# Patient Record
Sex: Male | Born: 1935 | Race: White | Hispanic: No | Marital: Married | Smoking: Former smoker
Health system: Southern US, Community
[De-identification: ages and names within clinical notes are randomized; demographics above are authoritative.]

## PROBLEM LIST (undated history)

## (undated) DIAGNOSIS — C61 Malignant neoplasm of prostate: Secondary | ICD-10-CM

## (undated) DIAGNOSIS — I4891 Unspecified atrial fibrillation: Secondary | ICD-10-CM

## (undated) DIAGNOSIS — E7800 Pure hypercholesterolemia, unspecified: Secondary | ICD-10-CM

## (undated) DIAGNOSIS — H3530 Unspecified macular degeneration: Secondary | ICD-10-CM

## (undated) DIAGNOSIS — I10 Essential (primary) hypertension: Secondary | ICD-10-CM

## (undated) DIAGNOSIS — K859 Acute pancreatitis without necrosis or infection, unspecified: Secondary | ICD-10-CM

## (undated) DIAGNOSIS — E119 Type 2 diabetes mellitus without complications: Secondary | ICD-10-CM

## (undated) DIAGNOSIS — I2510 Atherosclerotic heart disease of native coronary artery without angina pectoris: Secondary | ICD-10-CM

## (undated) HISTORY — DX: Atherosclerotic heart disease of native coronary artery without angina pectoris: I25.10

## (undated) HISTORY — PX: BRAIN SURGERY: SHX531

## (undated) HISTORY — PX: KNEE SURGERY: SHX244

## (undated) HISTORY — PX: TUMOR REMOVAL: SHX12

## (undated) HISTORY — PX: PROSTATECTOMY: SHX69

## (undated) HISTORY — DX: Acute pancreatitis without necrosis or infection, unspecified: K85.90

## (undated) HISTORY — PX: CHOLECYSTECTOMY: SHX55

## (undated) HISTORY — DX: Malignant neoplasm of prostate: C61

## (undated) HISTORY — PX: APPENDECTOMY: SHX54

## (undated) HISTORY — DX: Essential (primary) hypertension: I10

## (undated) HISTORY — DX: Pure hypercholesterolemia, unspecified: E78.00

## (undated) HISTORY — DX: Unspecified macular degeneration: H35.30

## (undated) HISTORY — PX: TONSILLECTOMY: SUR1361

## (undated) HISTORY — DX: Type 2 diabetes mellitus without complications: E11.9

---

## 2007-02-13 ENCOUNTER — Emergency Department (HOSPITAL_COMMUNITY): Admission: EM | Admit: 2007-02-13 | Discharge: 2007-02-13 | Payer: Self-pay | Admitting: Family Medicine

## 2007-10-05 ENCOUNTER — Encounter: Admission: RE | Admit: 2007-10-05 | Discharge: 2007-10-05 | Payer: Self-pay | Admitting: Otolaryngology

## 2007-12-15 ENCOUNTER — Ambulatory Visit (HOSPITAL_COMMUNITY): Admission: RE | Admit: 2007-12-15 | Discharge: 2007-12-15 | Payer: Self-pay | Admitting: Otolaryngology

## 2010-09-23 NOTE — Op Note (Signed)
Vincent Howell, Vincent Howell               ACCOUNT NO.:  [id number]   MEDICAL RECORD NO.:  [id number]          PATIENT TYPE:  AMB   LOCATION:  SDS                          FACILITY:  [facility]   PHYSICIAN:  Suzanna Obey, M.D.       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   PROCEDURES:  Left tympanic mastoidectomy with facial nerve monitoring.   ANESTHESIA:  General.   ESTIMATED BLOOD LOSS:  10 mL.   SURGEON:  Suzanna Obey, MD   INDICATIONS:  This is a 77 year old has had a problem with chronic  drainage from his left ear that has been refractory to medical therapy.  He had a tympanostomy tube placed and it continued to drain.  CT scan  was performed showing mastoid disease and a possible tegmal defect on  the lateral aspect.  The patient had a consultation with Dr. Kerri Perches  to evaluate the possibility of need of repair of the tegmen defect.  The  patient was informed of the risks and benefits of the procedure and  options were discussed.  All questions were answered and consent was  obtained.   OPERATION:  The patient was taken to the operating room and placed in  supine position after adequate general endotracheal tube anesthesia was  placed in the right gaze position.  The facial nerve monitor was  positioned and calibrated with low impedance and a good stimulation and  the postauricular canal was injected with 1% lidocaine with 1:100,000  epinephrine.  The patient was prepped and draped in the usual sterile  manner.  The ear canal was examined.  The tympanostomy tube was still in  place.  The sickle knife was used to make a 6 o'clock and 12 o'clock  incision and  connected with a round knife.  The middle ear was entered  by elevating the tympanomeatal flap.  The incus-stapedial joint appeared  to be intact and there did not appear to be any cholesteatoma debris  within the middle ear.  The postauricular incision was performed,  dissected down to the mastoid.  The canal was entered and the flap was  laid anterior and the self-retaining retractor was positioned.  The  mastoidectomy was then started dissecting down through the lateral  mastoid following the tegmen and the canal.  Within the canal wall out,  there was a lot of mucosal bands and thickening of the mucosa, but no  epithelial debris.  All these bands were broken up.  There was even a  band that completely covered over the antrum keeping it from aerating  the middle ear to the mastoid.  This was all removed and the dissection  was carried down to the fossa incudis where the incus was left intact.  The tegmen was dissected distending it out with  a large diamond bur.  There was no evidence of any obvious dehiscence and no evidence of any  CSF leak.  Where the bone was finished the location on the CT scan, a  bone pate was positioned up into that area and then Gelfoam was placed  inferior to this.  There was no other lesions or area in the mastoid.  All the cells seem to be opened with the dissection.  The mastoid was  flushed with saline and it flowed easily into the middle ear.  The  tympanomeatal flap was then laid back down in its anatomic position.  Gelfoam was placed over the tympanic membrane and flap.  The tube was  left in position.  The Gelfoam was placed into the mastoid, but just a  small amount was placed except where the bone pate was which was packed  fairly tightly.  The  postauricular incision was closed with interrupted 4-0 chromic and a  Dermabond to close the skin.  A dry sterile dressing was placed.  The  facial nerve monitor had never discharged.  The patient was awakened  and  brought to recovery room in stable condition.  Counts were correct.           ______________________________  Suzanna Obey, M.D.     JB/MEDQ  D:  12/15/2007  T:  12/16/2007  Job:  [id number]

## 2011-02-06 LAB — CBC
HCT: 44.2
Hemoglobin: 14.9
MCHC: 33.7
MCV: 90.9
RBC: 4.86

## 2011-02-06 LAB — BASIC METABOLIC PANEL
CO2: 28
Chloride: 101
GFR calc Af Amer: >60
Sodium: 136

## 2016-03-06 ENCOUNTER — Encounter: Payer: Self-pay | Admitting: Podiatry

## 2016-03-06 ENCOUNTER — Ambulatory Visit (INDEPENDENT_AMBULATORY_CARE_PROVIDER_SITE_OTHER): Payer: Medicare Other | Admitting: Podiatry

## 2016-03-06 DIAGNOSIS — M2042 Other hammer toe(s) (acquired), left foot: Secondary | ICD-10-CM

## 2016-03-06 DIAGNOSIS — L601 Onycholysis: Secondary | ICD-10-CM | POA: Diagnosis not present

## 2016-03-06 DIAGNOSIS — M2041 Other hammer toe(s) (acquired), right foot: Secondary | ICD-10-CM

## 2016-03-06 DIAGNOSIS — S90229A Contusion of unspecified lesser toe(s) with damage to nail, initial encounter: Secondary | ICD-10-CM | POA: Diagnosis not present

## 2016-03-06 NOTE — Progress Notes (Signed)
   Subjective:    Patient ID: Vincent Howell, male    DOB: August 08, 1932, 82 y.o.   MRN: [id number]  HPI 82 year old male presents the also concerns of pain to his right big toe and bilateral second toes. He states that his toes all weight hurt when he is walking on the treadmill. He walks 55 minutes in the morning at 45 minutes at nighttime. He states he is painful the tips the toes was on a treadmill. He is unsure if is coming from the toes from the shoes. He said no recent treatment for this. Denies any redness or drainage or any open sores. He is borderline diabetic. Denies any claudication symptoms. No numbness or tingling. No complaints.   Review of Systems  All other systems reviewed and are negative.      Objective:   Physical Exam General: AAO x3, NAD  Dermatological: Right hallux with evidence of previous blister which is healed to the medial aspect. Faint erythema remains with a blister was apposed subjective this area is much improved. Bilateral second toenails are hypertrophic, dystrophic and upon degree with a right second toenail did come off very easily without any bleeding or any open sores. The left second toe does appear to have some subungual hematoma mildly underneath the toenail. There is no edema, erythema, drainage or pus or any signs of infection. No open lesions or pre-ulcerative lesions identified.  Vascular: Dorsalis Pedis artery and Posterior Tibial artery pedal pulses are 2/4 bilateral with immedate capillary fill time. There is no pain with calf compression, swelling, warmth, erythema.   Neruologic: Grossly intact via light touch bilateral. Vibratory intact via tuning fork bilateral. Protective threshold with Semmes Wienstein monofilament intact to all pedal sites bilateral.  Musculoskeletal: Hammertoes are present. No pain, crepitus, or limitation noted with foot and ankle range of motion bilateral. Muscular strength 5/5 in all groups tested bilateral.  Gait:  Unassisted, Nonantalgic.      Assessment & Plan:  82 year old male hammertoes with subungual hematoma/resolved blistering likely due to shoe gear. -Treatment options discussed including all alternatives, risks, and complications -Etiology of symptoms were discussed -I believe the majority pain that he is having is due to his shoe as well as combined that with digital deformity. I discussed in shoe gear changes.  -Second digit toenails are debrided bilaterally in the right second toenail did come off very easily without any bleeding. Discussed that left side the toenail may come off as well. Monitor for infection. -Offloading pads were dispensed -If he continues to have symptoms to call the office. The meantime call any questions or concerns.  Celesta Gentile, DPM

## 2019-05-23 ENCOUNTER — Ambulatory Visit: Payer: Medicare Other | Attending: Internal Medicine

## 2019-05-23 DIAGNOSIS — Z23 Encounter for immunization: Secondary | ICD-10-CM | POA: Insufficient documentation

## 2019-05-23 NOTE — Progress Notes (Signed)
   Covid-19 Vaccination Clinic  Name:  Vincent Howell    MRN: [id number] DOB: May 24, 1932  05/23/2019  Vincent Howell was observed post Covid-19 immunization for 30 minutes based on pre-vaccination screening without incidence. He was provided with Vaccine Information Sheet and instruction to access the V-Safe system.   Vincent Howell was instructed to call 911 with any severe reactions post vaccine: Marland Kitchen Difficulty breathing  . Swelling of your face and throat  . A fast heartbeat  . A bad rash all over your body  . Dizziness and weakness    Immunizations Administered    Name Date Dose VIS Date Route   Pfizer COVID-19 Vaccine 05/23/2019  9:36 AM 0.3 mL 04/21/2019 Intramuscular   Manufacturer: [place]   Lot: [id number]   [place]: [id number]

## 2019-06-12 ENCOUNTER — Ambulatory Visit: Payer: Medicare Other | Attending: Internal Medicine

## 2019-06-12 DIAGNOSIS — Z23 Encounter for immunization: Secondary | ICD-10-CM

## 2019-06-12 NOTE — Progress Notes (Signed)
   Covid-19 Vaccination Clinic  Name:  Vincent Howell    MRN: [id number] DOB: October 21, 1932  06/12/2019  Vincent Howell was observed post Covid-19 immunization for 30 minutes based on pre-vaccination screening without incidence. He was provided with Vaccine Information Sheet and instruction to access the V-Safe system.   Vincent Howell was instructed to call 911 with any severe reactions post vaccine: Marland Kitchen Difficulty breathing  . Swelling of your face and throat  . A fast heartbeat  . A bad rash all over your body  . Dizziness and weakness    Immunizations Administered    Name Date Dose VIS Date Route   Pfizer COVID-19 Vaccine 06/12/2019  9:14 AM 0.3 mL 04/21/2019 Intramuscular   Manufacturer: [place]   Lot: [id number]   [place]: [id number]

## 2021-03-25 NOTE — Progress Notes (Deleted)
    Referring-Lawrence Caryl Comes, MD Reason for referral-atrial fibrillation  HPI: 87 year old male for evaluation of atrial fibrillation at request of Shelly Flatten, MD.  Current Outpatient Medications  Medication Sig Dispense Refill   amLODipine (NORVASC) 5 MG tablet      atorvastatin (LIPITOR) 40 MG tablet Take 40 mg by mouth.     No current facility-administered medications for this visit.    Allergies  Allergen Reactions   Asa [Aspirin]    Penicillins     No past medical history on file.  *** The histories are not reviewed yet. Please review them in the "History" navigator section and refresh this [place].  Social History   Socioeconomic History   Marital status: Married    Spouse name: Not on file   Number of children: Not on file   Years of education: Not on file   Highest education level: Not on file  Occupational History   Not on file  Tobacco Use   Smoking status: Never   Smokeless tobacco: Never  Substance and Sexual Activity   Alcohol use: Not on file   Drug use: Not on file   Sexual activity: Not on file  Other Topics Concern   Not on file  Social History Narrative   Not on file   Social Determinants of Health   Financial Resource Strain: Not on file  Food Insecurity: Not on file  Transportation Needs: Not on file  Physical Activity: Not on file  Stress: Not on file  Social Connections: Not on file  Intimate Partner Violence: Not on file    No family history on file.  ROS: no fevers or chills, productive cough, hemoptysis, dysphasia, odynophagia, melena, hematochezia, dysuria, hematuria, rash, seizure activity, orthopnea, PND, pedal edema, claudication. Remaining systems are negative.  Physical Exam:   There were no vitals taken for this visit.  General:  Well developed/well nourished in NAD Skin warm/dry Patient not depressed No peripheral clubbing Back-normal HEENT-normal/normal eyelids Neck supple/normal carotid upstroke  bilaterally; no bruits; no JVD; no thyromegaly chest - CTA/ normal expansion CV - RRR/normal S1 and S2; no murmurs, rubs or gallops;  PMI nondisplaced Abdomen -NT/ND, no HSM, no mass, + bowel sounds, no bruit 2+ femoral pulses, no bruits Ext-no edema, chords, 2+ DP Neuro-grossly nonfocal  ECG - personally reviewed  A/P  1 atrial fibrillation-  Kirk Ruths, MD

## 2021-04-07 ENCOUNTER — Ambulatory Visit: Payer: Medicare Other | Admitting: Cardiology

## 2021-05-10 NOTE — Progress Notes (Signed)
Referring-Vincent Caryl Comes, MD Reason for referral-atrial fibrillation  HPI: 87 year old male for evaluation of atrial fibrillation at request of Shelly Flatten, MD. Laboratories October 2022 showed hemoglobin 13.2, total cholesterol 116 with LDL 46, sodium 140, potassium 4.9, creatinine 0.8, normal liver functions, TSH 4.842.  Patient recently had an electrocardiogram in the office and it was felt to represent atrial fibrillation.  Cardiology asked to evaluate.  Also of note he states he had a myocardial infarction in the late 80s and had angioplasty at [facility].  Of note he denies dyspnea on exertion, orthopnea, PND, pedal edema, exertional chest pain, palpitations or syncope.  Current Outpatient Medications  Medication Sig Dispense Refill   amLODipine (NORVASC) 5 MG tablet      atorvastatin (LIPITOR) 40 MG tablet Take 40 mg by mouth.     Cholecalciferol (VITAMIN D) 50 MCG (2000 UT) CAPS Take 1 capsule by mouth daily.     fluticasone (VERAMYST) 27.5 MCG/SPRAY nasal spray Place 2 sprays into the nose daily.     glimepiride (AMARYL) 2 MG tablet Take by mouth.     metFORMIN (GLUCOPHAGE-XR) 500 MG 24 hr tablet Take 1,500 mg by mouth daily.     Multiple Vitamins-Minerals (ICAPS AREDS 2 PO) Take 1 tablet by mouth daily.     pyridOXINE (VITAMIN B-6) 100 MG tablet Take 100 mg by mouth daily.     vitamin B-12 (CYANOCOBALAMIN) 500 MCG tablet Take 500 mcg by mouth daily.     No current facility-administered medications for this visit.    Allergies  Allergen Reactions   Aspirin Anaphylaxis   Penicillins Anaphylaxis   Acetaminophen Other (See Comments)    Unknown   2,4-D Dimethylamine (Amisol) Rash   Benzoin Itching and Rash   Other Rash    Silk tape     Past Medical History:  Diagnosis Date   CAD (coronary artery disease)    Diabetes mellitus without complication (HCC)    Hypercholesteremia    Hypertension    Macular degeneration    Pancreatitis    Prostate cancer ([place])      Past Surgical History:  Procedure Laterality Date   APPENDECTOMY     BRAIN SURGERY     CHOLECYSTECTOMY     KNEE SURGERY     PROSTATECTOMY     TONSILLECTOMY      Social History   Socioeconomic History   Marital status: Married    Spouse name: Not on file   Number of children: 2   Years of education: Not on file   Highest education level: Not on file  Occupational History   Not on file  Tobacco Use   Smoking status: Former    Types: Cigarettes   Smokeless tobacco: Never  Substance and Sexual Activity   Alcohol use: Yes    Comment: Occasional   Drug use: Not on file   Sexual activity: Not on file  Other Topics Concern   Not on file  Social History Narrative   Not on file   Social Determinants of Health   Financial Resource Strain: Not on file  Food Insecurity: Not on file  Transportation Needs: Not on file  Physical Activity: Not on file  Stress: Not on file  Social Connections: Not on file  Intimate Partner Violence: Not on file    Family History  Problem Relation Age of Onset   Heart attack Brother     ROS: no fevers or chills, productive cough, hemoptysis, dysphasia, odynophagia, melena, hematochezia, dysuria, hematuria, rash,  seizure activity, orthopnea, PND, pedal edema, claudication. Remaining systems are negative.  Physical Exam:   Blood pressure (!) 152/70, pulse (!) 53, height 5' 9.5" (1.765 m), weight 171 lb 3.2 oz (77.7 kg), SpO2 98 %.  General:  Well developed/well nourished in NAD Skin warm/dry Patient not depressed No peripheral clubbing Back-normal HEENT-normal/normal eyelids Neck supple/normal carotid upstroke bilaterally; no bruits; no JVD; no thyromegaly chest - CTA/ normal expansion CV - RRR/normal S1 and S2; no murmurs, rubs or gallops;  PMI nondisplaced Abdomen -NT/ND, no HSM, no mass, + bowel sounds, no bruit 2+ femoral pulses, no bruits Ext-no edema, chords, 2+ DP Neuro-grossly nonfocal  ECG -February 09, 2021-sinus rhythm  with PACs, no ST changes.  Personally reviewed  Today's electrocardiogram personally reviewed and shows sinus bradycardia at a rate of 57 with sinus arrhythmia, incomplete right bundle branch block and no ST changes.  A/P  1 Question atrial fibrillation-I have personally reviewed the electrocardiograms from Dr. Olin Pia office and today's ECG.  While the patient's rhythm is irregular he is noted to have P waves and I do not think atrial fibrillation is present.  We will therefore not pursue further evaluation.  This was discussed in detail with the patient.  2 coronary artery disease-patient has a history of angioplasty of unknown vessel at [facility] in the 1980s by his report.  He has had no issues since that time.  We will continue statin.  He has a severe allergy to aspirin.  He also states he bleeds freely and is hesitant to take any other medications.  I will not add Plavix.  3 hypertension-blood pressure elevated; he states he typically runs in the 130s to 135 range at home.  We will continue amlodipine at present dose.  This can be advanced in the future if blood pressure is elevated.  4 hyperlipidemia-continue statin.  Most recent lipids outstanding.  Kirk Ruths, MD

## 2021-05-20 ENCOUNTER — Other Ambulatory Visit: Payer: Self-pay

## 2021-05-20 ENCOUNTER — Encounter: Payer: Self-pay | Admitting: Cardiology

## 2021-05-20 ENCOUNTER — Ambulatory Visit (INDEPENDENT_AMBULATORY_CARE_PROVIDER_SITE_OTHER): Payer: Medicare Other | Admitting: Cardiology

## 2021-05-20 VITALS — BP 152/70 | HR 53 | Ht 69.5 in | Wt 171.2 lb

## 2021-05-20 DIAGNOSIS — I480 Paroxysmal atrial fibrillation: Secondary | ICD-10-CM | POA: Diagnosis not present

## 2021-05-20 DIAGNOSIS — I10 Essential (primary) hypertension: Secondary | ICD-10-CM | POA: Diagnosis not present

## 2021-05-20 DIAGNOSIS — E7800 Pure hypercholesterolemia, unspecified: Secondary | ICD-10-CM

## 2021-05-20 DIAGNOSIS — I2510 Atherosclerotic heart disease of native coronary artery without angina pectoris: Secondary | ICD-10-CM

## 2021-05-20 NOTE — Patient Instructions (Signed)
°  Follow-Up: At [facility], you and your health needs are our priority.  As part of our continuing mission to provide you with exceptional heart care, we have created designated Provider Care Teams.  These Care Teams include your primary Cardiologist (physician) and Advanced Practice Providers (APPs -  Physician Assistants and Nurse Practitioners) who all work together to provide you with the care you need, when you need it.  We recommend signing up for the patient portal called "MyChart".  Sign up information is provided on this After Visit Summary.  MyChart is used to connect with patients for Virtual Visits (Telemedicine).  Patients are able to view lab/test results, encounter notes, upcoming appointments, etc.  Non-urgent messages can be sent to your provider as well.   To learn more about what you can do with MyChart, go to [email or web address].    Your next appointment:    As NEEDED

## 2021-05-27 ENCOUNTER — Encounter: Payer: Self-pay | Admitting: Cardiology

## 2021-06-29 ENCOUNTER — Encounter (HOSPITAL_BASED_OUTPATIENT_CLINIC_OR_DEPARTMENT_OTHER): Payer: Self-pay | Admitting: Emergency Medicine

## 2021-06-29 ENCOUNTER — Emergency Department (HOSPITAL_BASED_OUTPATIENT_CLINIC_OR_DEPARTMENT_OTHER)
Admission: EM | Admit: 2021-06-29 | Discharge: 2021-06-29 | Disposition: A | Discharge status: Home / Self Care | Payer: Medicare Other | Attending: Emergency Medicine | Admitting: Emergency Medicine

## 2021-06-29 ENCOUNTER — Other Ambulatory Visit: Payer: Self-pay

## 2021-06-29 ENCOUNTER — Emergency Department (HOSPITAL_BASED_OUTPATIENT_CLINIC_OR_DEPARTMENT_OTHER): Payer: Medicare Other

## 2021-06-29 DIAGNOSIS — Z79899 Other long term (current) drug therapy: Secondary | ICD-10-CM | POA: Diagnosis not present

## 2021-06-29 DIAGNOSIS — R55 Syncope and collapse: Secondary | ICD-10-CM | POA: Insufficient documentation

## 2021-06-29 DIAGNOSIS — R42 Dizziness and giddiness: Secondary | ICD-10-CM | POA: Diagnosis not present

## 2021-06-29 DIAGNOSIS — H538 Other visual disturbances: Secondary | ICD-10-CM | POA: Diagnosis not present

## 2021-06-29 HISTORY — DX: Unspecified atrial fibrillation: I48.91

## 2021-06-29 LAB — DIFFERENTIAL
Abs Immature Granulocytes: 0.01 10*3/uL (ref 0.00–0.07)
Basophils Absolute: 0.1 10*3/uL (ref 0.0–0.1)
Basophils Relative: 1 %
Eosinophils Absolute: 0.2 10*3/uL (ref 0.0–0.5)
Eosinophils Relative: 4 %
Immature Granulocytes: 0 %
Lymphocytes Relative: 24 %
Lymphs Abs: 1.6 10*3/uL (ref 0.7–4.0)
Monocytes Absolute: 0.6 10*3/uL (ref 0.1–1.0)
Monocytes Relative: 9 %
Neutro Abs: 4.1 10*3/uL (ref 1.7–7.7)
Neutrophils Relative %: 62 %

## 2021-06-29 LAB — CBC
HCT: 40.9 % (ref 39.0–52.0)
Hemoglobin: 13.5 g/dL (ref 13.0–17.0)
MCH: 29.9 pg (ref 26.0–34.0)
MCHC: 33 g/dL (ref 30.0–36.0)
MCV: 90.7 fL (ref 80.0–100.0)
Platelets: 207 10*3/uL (ref 150–400)
RBC: 4.51 MIL/uL (ref 4.22–5.81)
RDW: 13.9 % (ref 11.5–15.5)
WBC: 6.7 10*3/uL (ref 4.0–10.5)
nRBC: 0 % (ref 0.0–0.2)

## 2021-06-29 LAB — COMPREHENSIVE METABOLIC PANEL
ALT: 18 U/L (ref 0–44)
AST: 19 U/L (ref 15–41)
Albumin: 4.3 g/dL (ref 3.5–5.0)
Alkaline Phosphatase: 95 U/L (ref 38–126)
Anion gap: 9 (ref 5–15)
BUN: 16 mg/dL (ref 8–23)
CO2: 25 mmol/L (ref 22–32)
Calcium: 9.1 mg/dL (ref 8.9–10.3)
Chloride: 103 mmol/L (ref 98–111)
Creatinine, Ser: 0.78 mg/dL (ref 0.61–1.24)
GFR, Estimated: >60 mL/min (ref >60)
Glucose, Bld: 272 mg/dL — ABNORMAL HIGH (ref 70–99)
Potassium: 3.6 mmol/L (ref 3.5–5.1)
Sodium: 137 mmol/L (ref 135–145)
Total Bilirubin: 0.9 mg/dL (ref 0.3–1.2)
Total Protein: 7.4 g/dL (ref 6.5–8.1)

## 2021-06-29 LAB — PROTIME-INR
INR: 1 (ref 0.8–1.2)
Prothrombin Time: 13.2 seconds (ref 11.4–15.2)

## 2021-06-29 LAB — CBG MONITORING, ED: Glucose-Capillary: 302 mg/dL — ABNORMAL HIGH (ref 70–99)

## 2021-06-29 LAB — APTT: aPTT: 29 seconds (ref 24–36)

## 2021-06-29 MED ORDER — SODIUM CHLORIDE 0.9% FLUSH
3.0000 mL | Freq: Once | INTRAVENOUS | Status: DC
  Filled 2021-06-29: qty 3

## 2021-06-29 NOTE — ED Triage Notes (Signed)
Pt revised and states his symptoms now started on Monday with dizziness but became confused [email or web address]

## 2021-06-29 NOTE — Discharge Instructions (Addendum)
You were evaluated in the Emergency Department and after careful evaluation, we did not find any emergent condition requiring admission or further testing in the hospital.  Your exam/testing today was overall reassuring.  Your laboratory work-up was unremarkable with the exception of an elevated blood glucose to 302 after your breakfast this morning.  Your CT results are as follows: IMPRESSION:  1. No acute intracranial abnormality.  2. Chronic small vessel ischemic disease.  3. Chronic paranasal sinus mucosal disease.       Your symptoms are consistent with most likely near syncopal episodes of unclear etiology.  Recommend you follow-up with cardiology to discuss outpatient cardiac monitoring.  Return for any recurrence of symptoms, new onset syncope, chest pain, heart palpitations, low or significantly elevated blood pressure resulting in symptoms.  Your neurologic exam today was reassuring and normal.  Lower suspicion for TIA or CVA at this time based on your history of present illness and physical exam findings.  Please return to the Emergency Department if you experience any worsening of your condition.  Thank you for allowing [place] to be a part of your care.

## 2021-06-29 NOTE — ED Triage Notes (Signed)
Pt presents with complaints of disorientation yesterday that has waxed and waned. Pt also states his vision has been blurred for a week the patient today also has some dizziness.ambulatory, alert and oriented x4

## 2021-06-29 NOTE — ED Provider Notes (Signed)
[facility] DE[facility] Provider Note   CSN: [id number] Arrival date & time: 06/29/21  0846     History  Chief Complaint  Patient presents with   Dizziness    [name] is a 88 y.o. male.   Dizziness  88 year old male who presents to the emergency department with a chief complaint of lightheadedness.  The patient states that he has had intermittent episodes of lightheadedness over the past 2 days.  He had mild blurred vision but no visual field deficits during that time.  He denied any episodes of syncope.  He denied any other neurologic deficits.  No facial droop, numbness.  No weakness or numbness of the extremities.  No dysarthria or dysphagia.  He states that during this episode he was briefly disoriented but that has since resolved.  He denies any symptoms at this time.  He denies any chest pain, palpitations, shortness of breath, lightheadedness.    Home Medications Prior to Admission medications   Medication Sig Start Date End Date Taking? Authorizing Provider  amLODipine (NORVASC) 5 MG tablet  03/02/16   [provider]  atorvastatin (LIPITOR) 40 MG tablet Take 40 mg by mouth.    [provider]  Cholecalciferol (VITAMIN D) 50 MCG (2000 UT) CAPS Take 1 capsule by mouth daily.    [provider]  fluticasone (VERAMYST) 27.5 MCG/SPRAY nasal spray Place 2 sprays into the nose daily.    [provider]  glimepiride (AMARYL) 2 MG tablet Take by mouth. 11/15/20   [provider]  metFORMIN (GLUCOPHAGE-XR) 500 MG 24 hr tablet Take 1,500 mg by mouth daily. 03/27/21   [provider]  Multiple Vitamins-Minerals (ICAPS AREDS 2 PO) Take 1 tablet by mouth daily.    [provider]  pyridOXINE (VITAMIN B-6) 100 MG tablet Take 100 mg by mouth daily.    [provider]  vitamin B-12 (CYANOCOBALAMIN) 500 MCG tablet Take 500 mcg by mouth daily.    [provider]      Allergies     Aspirin; Penicillins; Acetaminophen; 2,4-d dimethylamine; Benzoin; and Other    Review of Systems   Review of Systems  Neurological:  Positive for light-headedness.  All other systems reviewed and are negative.  Physical Exam Updated Vital Signs BP (!) 147/76    Pulse (!) 54    Temp 97.8 F (36.6 C) (Oral)    Resp 13    SpO2 98%  Physical Exam Vitals and nursing note reviewed.  Constitutional:      General: He is not in acute distress.    Appearance: He is well-developed.  HENT:     Head: Normocephalic and atraumatic.  Eyes:     Conjunctiva/sclera: Conjunctivae normal.     Pupils: Pupils are equal, round, and reactive to light.  Cardiovascular:     Rate and Rhythm: Normal rate. Rhythm irregular.     Heart sounds: No murmur heard. Pulmonary:     Effort: Pulmonary effort is normal. No respiratory distress.     Breath sounds: Normal breath sounds.  Abdominal:     General: There is no distension.     Palpations: Abdomen is soft.     Tenderness: There is no abdominal tenderness. There is no guarding.  Musculoskeletal:        General: No swelling, deformity or signs of injury.     Cervical back: Neck supple.  Skin:    General: Skin is warm and dry.     Capillary Refill: Capillary  refill takes less than 2 seconds.     Findings: No lesion or rash.  Neurological:     General: No focal deficit present.     Mental Status: He is alert. Mental status is at baseline.     Cranial Nerves: No cranial nerve deficit.     Sensory: No sensory deficit.     Motor: No weakness.     Coordination: Coordination normal.     Gait: Gait normal.  Psychiatric:        Mood and Affect: Mood normal.    ED Results / Procedures / Treatments   Labs (all labs ordered are listed, but only abnormal results are displayed) Labs Reviewed  COMPREHENSIVE METABOLIC PANEL - Abnormal; Notable for the following components:      Result Value   Glucose, Bld 272 (*)    All other components within normal limits   CBG MONITORING, ED - Abnormal; Notable for the following components:   Glucose-Capillary 302 (*)    All other components within normal limits  PROTIME-INR  APTT  CBC  DIFFERENTIAL  CBG MONITORING, ED    EKG EKG Interpretation  Date/Time:  Sunday June 29 2021 09:02:00 EST Ventricular Rate:  59 PR Interval:    QRS Duration: 125 QT Interval:  434 QTC Calculation: 430 R Axis:   38 Text Interpretation: Sinus bradycardia Ventricular premature complex Right bundle branch block Reconfirmed by Regan Lemming ([id number]) on 07/01/2021 9:54:08 PM  Radiology No results found.  Procedures Procedures    Medications Ordered in ED Medications - No data to display  ED Course/ Medical Decision Making/ A&P                           Medical Decision Making Amount and/or Complexity of Data Reviewed Labs: ordered. Radiology: ordered.    88 year old male who presents to the emergency department with a chief complaint of lightheadedness.  The patient states that he has had intermittent episodes of lightheadedness over the past 2 days.  He had mild blurred vision but no visual field deficits during that time.  He denied any episodes of syncope.  He denied any other neurologic deficits.  No facial droop, numbness.  No weakness or numbness of the extremities.  No dysarthria or dysphagia.  He states that during this episode he was briefly disoriented but that has since resolved.  He denies any symptoms at this time.  He denies any chest pain, palpitations, shortness of breath, lightheadedness.   On arrival, the patient was afebrile, hemodynamically stable.  He was in very mild sinus bradycardia on cardiac telemetry.  This was confirmed by EKG with sinus bradycardia, ventricular rate 5 9, QTc 430, PVCs present.  No ischemic changes noted.  He is ambulatory without difficulty.  He has a normal neurologic exam and at his baseline mental status.  A CT head was performed which revealed no acute  abnormality. IMPRESSION:  1. No acute intracranial abnormality.  2. Chronic small vessel ischemic disease.  3. Chronic paranasal sinus mucosal disease.   Screening laboratory work-up revealed no significant electrolyte abnormality, mild hyperglycemia to 272, normal renal and liver function.  CBC without a leukocytosis or anemia.  Patient physical exam significant for lungs that were clear to auscultation bilaterally, normal neurologic exam, symmetric pulses equally, irregular rhythm, normal rate on exam.   No cardiac arrhythmia noted on telemetry or EKG.  He follows outpatient with cardiology.  I reviewed the patient's recent cardiology clinic  note.  Patient was evaluated in clinic for possible atrial fibrillation although this was questioned by cardiology with sinus rhythm noted on EKG and no clear atrial fibrillation on EKG.  No further work-up was recommended.  Patient does have a remote history of coronary artery disease.  He currently denies any chest pain, shortness of breath heart palpitations.  He has had mild lightheadedness which has resolved and occurred yesterday.  He has no evidence of new onset heart failure.  No evidence of cardiac arrhythmia.  He appears well-hydrated.  Given the patient's lightheadedness, I discussed and considered admission for observation versus discharge home.  After discussion of risks and benefits of admission for telemetry monitoring versus follow-up outpatient with cardiology for possible outpatient cardiac monitoring, the patient elected to follow-up outpatient with cardiology.  I believe that this is safe and reasonable.  Stable for discharge at this time.   Final Clinical Impression(s) / ED Diagnoses Final diagnoses:  Near syncope    Rx / DC Orders ED Discharge Orders     None         Regan Lemming, MD 07/01/21 2201

## 2021-08-05 ENCOUNTER — Other Ambulatory Visit: Payer: Self-pay

## 2021-08-05 ENCOUNTER — Encounter (HOSPITAL_COMMUNITY): Payer: Self-pay | Admitting: Pharmacy Technician

## 2021-08-05 ENCOUNTER — Inpatient Hospital Stay (HOSPITAL_COMMUNITY)
Admission: EM | Admit: 2021-08-05 | Discharge: 2021-08-08 | DRG: 286 | Disposition: A | Discharge status: Home / Self Care | Payer: Medicare Other | Attending: Cardiology | Admitting: Cardiology

## 2021-08-05 ENCOUNTER — Encounter: Payer: Self-pay | Admitting: Cardiology

## 2021-08-05 ENCOUNTER — Observation Stay (HOSPITAL_COMMUNITY): Payer: Medicare Other

## 2021-08-05 ENCOUNTER — Telehealth: Payer: Self-pay | Admitting: Cardiology

## 2021-08-05 ENCOUNTER — Ambulatory Visit (INDEPENDENT_AMBULATORY_CARE_PROVIDER_SITE_OTHER): Payer: Medicare Other | Admitting: Cardiology

## 2021-08-05 VITALS — BP 92/58 | HR 60 | Ht 69.5 in | Wt 163.0 lb

## 2021-08-05 DIAGNOSIS — I480 Paroxysmal atrial fibrillation: Secondary | ICD-10-CM | POA: Diagnosis not present

## 2021-08-05 DIAGNOSIS — R55 Syncope and collapse: Secondary | ICD-10-CM

## 2021-08-05 DIAGNOSIS — I4819 Other persistent atrial fibrillation: Secondary | ICD-10-CM | POA: Diagnosis present

## 2021-08-05 DIAGNOSIS — I110 Hypertensive heart disease with heart failure: Principal | ICD-10-CM | POA: Diagnosis present

## 2021-08-05 DIAGNOSIS — I5041 Acute combined systolic (congestive) and diastolic (congestive) heart failure: Secondary | ICD-10-CM

## 2021-08-05 DIAGNOSIS — E7800 Pure hypercholesterolemia, unspecified: Secondary | ICD-10-CM | POA: Diagnosis present

## 2021-08-05 DIAGNOSIS — I2510 Atherosclerotic heart disease of native coronary artery without angina pectoris: Secondary | ICD-10-CM | POA: Diagnosis not present

## 2021-08-05 DIAGNOSIS — E785 Hyperlipidemia, unspecified: Secondary | ICD-10-CM

## 2021-08-05 DIAGNOSIS — Z79899 Other long term (current) drug therapy: Secondary | ICD-10-CM

## 2021-08-05 DIAGNOSIS — I10 Essential (primary) hypertension: Secondary | ICD-10-CM | POA: Diagnosis not present

## 2021-08-05 DIAGNOSIS — I472 Ventricular tachycardia, unspecified: Secondary | ICD-10-CM | POA: Diagnosis not present

## 2021-08-05 DIAGNOSIS — E119 Type 2 diabetes mellitus without complications: Secondary | ICD-10-CM | POA: Diagnosis present

## 2021-08-05 DIAGNOSIS — I4891 Unspecified atrial fibrillation: Secondary | ICD-10-CM

## 2021-08-05 DIAGNOSIS — Z20822 Contact with and (suspected) exposure to covid-19: Secondary | ICD-10-CM | POA: Diagnosis present

## 2021-08-05 DIAGNOSIS — Z87891 Personal history of nicotine dependence: Secondary | ICD-10-CM

## 2021-08-05 DIAGNOSIS — Z8546 Personal history of malignant neoplasm of prostate: Secondary | ICD-10-CM

## 2021-08-05 DIAGNOSIS — Z7901 Long term (current) use of anticoagulants: Secondary | ICD-10-CM

## 2021-08-05 DIAGNOSIS — I493 Ventricular premature depolarization: Secondary | ICD-10-CM | POA: Diagnosis present

## 2021-08-05 DIAGNOSIS — Z7984 Long term (current) use of oral hypoglycemic drugs: Secondary | ICD-10-CM

## 2021-08-05 DIAGNOSIS — E1169 Type 2 diabetes mellitus with other specified complication: Secondary | ICD-10-CM

## 2021-08-05 DIAGNOSIS — I9589 Other hypotension: Secondary | ICD-10-CM | POA: Diagnosis present

## 2021-08-05 DIAGNOSIS — Z8249 Family history of ischemic heart disease and other diseases of the circulatory system: Secondary | ICD-10-CM

## 2021-08-05 LAB — CBC WITH DIFFERENTIAL/PLATELET
Abs Immature Granulocytes: 0.01 10*3/uL (ref 0.00–0.07)
Basophils Absolute: 0 10*3/uL (ref 0.0–0.1)
Basophils Relative: 1 %
Eosinophils Absolute: 0 10*3/uL (ref 0.0–0.5)
Eosinophils Relative: 1 %
HCT: 46.1 % (ref 39.0–52.0)
Hemoglobin: 15 g/dL (ref 13.0–17.0)
Immature Granulocytes: 0 %
Lymphocytes Relative: 20 %
Lymphs Abs: 1.1 10*3/uL (ref 0.7–4.0)
MCH: 30.1 pg (ref 26.0–34.0)
MCHC: 32.5 g/dL (ref 30.0–36.0)
MCV: 92.4 fL (ref 80.0–100.0)
Monocytes Absolute: 0.9 10*3/uL (ref 0.1–1.0)
Monocytes Relative: 15 %
Neutro Abs: 3.5 10*3/uL (ref 1.7–7.7)
Neutrophils Relative %: 63 %
Platelets: 194 10*3/uL (ref 150–400)
RBC: 4.99 MIL/uL (ref 4.22–5.81)
RDW: 13.9 % (ref 11.5–15.5)
WBC: 5.5 10*3/uL (ref 4.0–10.5)
nRBC: 0 % (ref 0.0–0.2)

## 2021-08-05 LAB — T4, FREE: Free T4: 0.76 ng/dL (ref 0.61–1.12)

## 2021-08-05 LAB — COMPREHENSIVE METABOLIC PANEL
ALT: 19 U/L (ref 0–44)
AST: 24 U/L (ref 15–41)
Albumin: 4.1 g/dL (ref 3.5–5.0)
Alkaline Phosphatase: 74 U/L (ref 38–126)
Anion gap: 10 (ref 5–15)
BUN: 25 mg/dL — ABNORMAL HIGH (ref 8–23)
CO2: 23 mmol/L (ref 22–32)
Calcium: 9.5 mg/dL (ref 8.9–10.3)
Chloride: 105 mmol/L (ref 98–111)
Creatinine, Ser: 1.26 mg/dL — ABNORMAL HIGH (ref 0.61–1.24)
GFR, Estimated: 56 mL/min — ABNORMAL LOW (ref >60)
Glucose, Bld: 139 mg/dL — ABNORMAL HIGH (ref 70–99)
Potassium: 4.5 mmol/L (ref 3.5–5.1)
Sodium: 138 mmol/L (ref 135–145)
Total Bilirubin: 1.1 mg/dL (ref 0.3–1.2)
Total Protein: 7.6 g/dL (ref 6.5–8.1)

## 2021-08-05 LAB — SARS CORONAVIRUS 2 BY RT PCR (HOSPITAL ORDER, PERFORMED IN ~~LOC~~ HOSPITAL LAB): SARS Coronavirus 2: NEGATIVE

## 2021-08-05 LAB — HEMOGLOBIN A1C
Hgb A1c MFr Bld: 6.2 % — ABNORMAL HIGH (ref 4.8–5.6)
Mean Plasma Glucose: 131.24 mg/dL

## 2021-08-05 LAB — TSH: TSH: 3.77 u[IU]/mL (ref 0.350–4.500)

## 2021-08-05 LAB — MAGNESIUM: Magnesium: 1.9 mg/dL (ref 1.7–2.4)

## 2021-08-05 LAB — TROPONIN I (HIGH SENSITIVITY)
Troponin I (High Sensitivity): 15 ng/L (ref <18)
Troponin I (High Sensitivity): 15 ng/L (ref <18)

## 2021-08-05 LAB — CBG MONITORING, ED: Glucose-Capillary: 170 mg/dL — ABNORMAL HIGH (ref 70–99)

## 2021-08-05 MED ORDER — HEPARIN SODIUM (PORCINE) 5000 UNIT/ML IJ SOLN
5000.0000 [IU] | Freq: Three times a day (TID) | INTRAMUSCULAR | Status: DC
  Administered 2021-08-05: 5000 [IU] via SUBCUTANEOUS
  Filled 2021-08-05 (×2): qty 1

## 2021-08-05 MED ORDER — VITAMIN B-6 100 MG PO TABS
100.0000 mg | ORAL_TABLET | Freq: Every day | ORAL | Status: DC
  Administered 2021-08-06 – 2021-08-08 (×3): 100 mg via ORAL
  Filled 2021-08-05 (×3): qty 1

## 2021-08-05 MED ORDER — SODIUM CHLORIDE 0.9 % IV SOLN: INTRAVENOUS | Status: AC

## 2021-08-05 MED ORDER — ASPIRIN 81 MG PO CHEW: 324.0000 mg | CHEWABLE_TABLET | ORAL | Status: DC

## 2021-08-05 MED ORDER — ATORVASTATIN CALCIUM 40 MG PO TABS
40.0000 mg | ORAL_TABLET | Freq: Every day | ORAL | Status: DC
  Administered 2021-08-06 – 2021-08-08 (×3): 40 mg via ORAL
  Filled 2021-08-05 (×3): qty 1

## 2021-08-05 MED ORDER — ACETAMINOPHEN 325 MG PO TABS: 650.0000 mg | ORAL_TABLET | ORAL | Status: DC | PRN

## 2021-08-05 MED ORDER — INSULIN ASPART 100 UNIT/ML IJ SOLN
0.0000 [IU] | Freq: Three times a day (TID) | INTRAMUSCULAR | Status: DC
  Administered 2021-08-06: 1 [IU] via SUBCUTANEOUS

## 2021-08-05 MED ORDER — ASPIRIN 300 MG RE SUPP: 300.0000 mg | RECTAL | Status: DC

## 2021-08-05 MED ORDER — ASPIRIN EC 81 MG PO TBEC: 81.0000 mg | DELAYED_RELEASE_TABLET | Freq: Every day | ORAL | Status: DC

## 2021-08-05 MED ORDER — AMIODARONE HCL IN DEXTROSE 360-4.14 MG/200ML-% IV SOLN
30.0000 mg/h | INTRAVENOUS | Status: DC
  Administered 2021-08-06 (×2): 30 mg/h via INTRAVENOUS
  Filled 2021-08-05: qty 200

## 2021-08-05 MED ORDER — AMIODARONE HCL IN DEXTROSE 360-4.14 MG/200ML-% IV SOLN
60.0000 mg/h | INTRAVENOUS | Status: AC
  Administered 2021-08-05: 60 mg/h via INTRAVENOUS
  Filled 2021-08-05: qty 200

## 2021-08-05 MED ORDER — INSULIN ASPART 100 UNIT/ML IJ SOLN: 0.0000 [IU] | Freq: Every day | INTRAMUSCULAR | Status: DC

## 2021-08-05 MED ORDER — ONDANSETRON HCL 4 MG/2ML IJ SOLN: 4.0000 mg | Freq: Four times a day (QID) | INTRAMUSCULAR | Status: DC | PRN

## 2021-08-05 MED ORDER — NITROGLYCERIN 0.4 MG SL SUBL: 0.4000 mg | SUBLINGUAL_TABLET | SUBLINGUAL | Status: DC | PRN

## 2021-08-05 MED ORDER — HEPARIN SODIUM (PORCINE) 5000 UNIT/ML IJ SOLN
5000.0000 [IU] | Freq: Three times a day (TID) | INTRAMUSCULAR | Status: DC
  Administered 2021-08-05 – 2021-08-06 (×2): 5000 [IU] via SUBCUTANEOUS
  Filled 2021-08-05: qty 1

## 2021-08-05 NOTE — H&P (Addendum)
?Cardiology Admission History and Physical:  ? ?Patient ID: Vincent Howell ?MRN: [id number]; DOB: Jun 05, 1932  ? ?Admission date: 08/05/2021 ? ?PCP:  Pcp, No ?  ?[facility] Providers ?Cardiologist:  Kirk Ruths, MD      ? ? ?Chief Complaint:  syncope   ?Pt seen in office by Dr. Stanford Breed and with no beds for direct admit pt sent to ER.   ? ?  ?  ?  ?  ?HPI: Evaluate syncope and FU coronary artery disease.  Patient apparently had angioplasty of unknown vessel at [facility] in the 1980s.  Patient seen in the office January 2023 with question atrial fibrillation.  His electrocardiograms were reviewed and revealed sinus rhythm with PACs.  Patient seen in the emergency room February 2023 with near syncopal episodes.  Patient states that these episodes occurred while sitting.  He would feel lightheaded with no associated symptoms.  Symptoms would last 15 minutes to 2 hours.  He did not check his blood pressure during that time.  ER labs show BUN 16 and creatinine 0.78.  Hemoglobin 13.5.  Electrocardiogram showed sinus rhythm with PAC and PVC.  Note blood pressure at that time was 147/76.  Heart rate 54.  Patient contacted the office today with syncope and added to my schedule.  Since last seen patient had a syncopal episode this morning at 8 AM.  He states he was in the shower and became dizzy followed by frank syncope for several seconds.  He found himself on the floor.  No preceding chest pain, palpitations, nausea, dyspnea.  No incontinence or seizure activity.  No injury. ? ?On Arrival to ER he was found to be in a fib with RVR.  BP 712 systolic.  No lightheadedness or dizziness currently.  Unaware of rapid HR and no chest pain or SOB.  No history of bleeding.  BP is 458 systolic now.  ? ?Pt does tell me yesterday with allergy symptoms with sore throat and runny nose.   Will check for Covid to eval.  ?  ?      ?Current Outpatient Medications  ?Medication Sig Dispense Refill  ? amLODipine (NORVASC) 5 MG tablet         ? atorvastatin (LIPITOR) 40 MG tablet Take 40 mg by mouth.      ? Cholecalciferol (VITAMIN D) 50 MCG (2000 UT) CAPS Take 1 capsule by mouth daily.      ? fluticasone (VERAMYST) 27.5 MCG/SPRAY nasal spray Place 2 sprays into the nose daily.      ? glimepiride (AMARYL) 2 MG tablet Take by mouth.      ? metFORMIN (GLUCOPHAGE-XR) 500 MG 24 hr tablet Take 1,500 mg by mouth daily.      ? Multiple Vitamins-Minerals (ICAPS AREDS 2 PO) Take 1 tablet by mouth daily.      ? pyridOXINE (VITAMIN B-6) 100 MG tablet Take 100 mg by mouth daily.      ? vitamin B-12 (CYANOCOBALAMIN) 500 MCG tablet Take 500 mcg by mouth daily.      ?  ?No current facility-administered medications for this visit.  ?  ?  ?  ?    ?Past Medical History:  ?Diagnosis Date  ? CAD (coronary artery disease)    ? Diabetes mellitus without complication ([place])    ? Hypercholesteremia    ? Hypertension    ? Macular degeneration    ? Pancreatitis    ? Prostate cancer ([place])    ?  ?  ?     ?  Past Surgical History:  ?Procedure Laterality Date  ? APPENDECTOMY      ? BRAIN SURGERY      ? CHOLECYSTECTOMY      ? KNEE SURGERY      ? PROSTATECTOMY      ? TONSILLECTOMY      ?  ?  ?Social History  ?  ?     ?Socioeconomic History  ? Marital status: Married  ?    Spouse name: Not on file  ? Number of children: 2  ? Years of education: Not on file  ? Highest education level: Not on file  ?Occupational History  ? Not on file  ?Tobacco Use  ? Smoking status: Former  ?    Types: Cigarettes  ? Smokeless tobacco: Never  ?Substance and Sexual Activity  ? Alcohol use: Yes  ?    Comment: Occasional  ? Drug use: Not on file  ? Sexual activity: Not on file  ?Other Topics Concern  ? Not on file  ?Social History Narrative  ? Not on file  ?  ?Social Determinants of Health  ?  ?Financial Resource Strain: Not on file  ?Food Insecurity: Not on file  ?Transportation Needs: Not on file  ?Physical Activity: Not on file  ?Stress: Not on file  ?Social Connections: Not on file  ?Intimate Partner  Violence: Not on file  ?  ?  ?     ?Family History  ?Problem Relation Age of Onset  ? Heart attack Brother    ?  ?  ?ROS: no fevers or chills, productive cough, hemoptysis, dysphasia, odynophagia, melena, hematochezia, dysuria, hematuria, rash, seizure activity, orthopnea, PND, pedal edema, claudication. Remaining systems are negative. ?  ?Physical Exam: ?Well-developed well-nourished in no acute distress.  ?Skin is warm and dry.  ?HEENT is normal.  Normal eyelids ?Neck is supple.  No JVD ?Chest is clear to auscultation with normal expansion.  ?Cardiovascular exam is regular rate and rhythm.  No murmur ?Abdominal exam nontender or distended. No masses palpated. ?Extremities show no edema. ?neuro grossly intact ?Psych-normal affect ? ?Addendum in ER Heart rapid and irregular no murmur gallup or rub.  ?Otherwise no change in exam.  ?  ?ECG-sinus with PACs, RV conduction delay.  Personally reviewed ?  ?A/P ?  ?1 syncope-etiology unclear.  His blood pressure was low today and this certainly could be the cause.  However he was seen in the emergency room in February with similar episodes and his blood pressure was normal.  I will admit to telemetry (note patient has no conduction abnormalities on ECG).  Follow for any arrhythmias.  We will arrange echocardiogram tomorrow morning to assess LV function.  If no significant arrhythmias on monitor and LV function normal we will plan outpatient event monitor.  I would plan to discontinue amlodipine as he is hypotensive today.  Follow blood pressure and adjust medications as needed.  I have instructed the patient not to drive. ? ?2.  Atrial fib Addendum on arrival to ER pt found to be in atrial fib with RVR.  Incomplete RBBB, different from office EKG. With new atrial fib. ?CHA2DS2VASc is 5 will plan anticoagulation.  Will need  rhythm control.   With low BP will begin with amiodarone drip but no bolus. Pt is going in and out of atrial fib and in Afib RVR.   ?--check TSH, echo  ordered ?--with cold symptoms yesterday allergy sore throat will check for covid ? ?Syncope may have been atrial fib with  hot shower vs conversion to SR in hot shower.  Will monitor.  ?  ?3 coronary artery disease-patient denies chest pain.  Patient has an allergy anaphylaxis  ?to aspirin.  He also previously declined Plavix as he states he bleeds freely and was hesitant to take other medications.  We will continue statin.  We discussed anticoagulation will begin with IV heparin to monitor for bleeding. ?  ?4 hypertension-blood pressure low today.  We will hold amlodipine and follow. ?  ?5 hyperlipidemia-continue statin. ? ?6.  DM-2 hold amaryl and metformin use SSI  ?  ?Kirk Ruths, MD ?Severity of Illness: ?The appropriate patient status for this patient is OBSERVATION. Observation status is judged to be reasonable and necessary in order to provide the required intensity of service to ensure the patient's safety. The patient's presenting symptoms, physical exam findings, and initial radiographic and laboratory data in the context of their medical condition is felt to place them at decreased risk for further clinical deterioration. Furthermore, it is anticipated that the patient will be medically stable for discharge from the hospital within 2 midnights of admission.   ? ?For questions or updates, please contact [place] ?Please consult [email or web address] for contact info under  ? ?  ?Signed, ?Cecilie Kicks ? ?Patient seen and examined   I agree with findings as noted above by L Dorene Ar  ?Pt seen by B Crenshaw today    Syncopal spell in shower   Felt fine before today   He was in SR in clinc  ?ON arrival to ER he was in Afib with RVR  He has been back and forth afib / SR   No signifiant pauses   Pt denies palpitations  ? ?Currently he says he feels OK in bed ?Neck:  JVP is normal  ?LUngs are CTA ?Cardiac  Irreg irreg   Tachy  No S3    No murmurs  ?ABd is benign ?Ext  are without edema   2+  pulses ? ?Impression ? ?PAF   NEw Dx   This may explain today's spell in shower    Rapid rates or ? Pause when converts.    ?Recommendations ?Continue tele.     ?Start heparin     ?With back/forth afib/sinus I would recomm amiod

## 2021-08-05 NOTE — Telephone Encounter (Signed)
Patient reported that at 10 am he had a syncopal episode while in the shower and fell. He did not hit his head. BP was 88/88, P 53. A while ago, BP 112/68, P 60. "I don't feel quite right." He spoke with PCP today who recommended he see cardiologist. Appointment for today at 3:30 with Dr. Stanford Breed. ?

## 2021-08-05 NOTE — ED Triage Notes (Signed)
Pt here with reports of having intermittent episodes of lightheadedness for the last month. Today pt with syncopal event. Went to cardiologist today who sent pt over here. Pt in afib rvr, no history.  ?

## 2021-08-05 NOTE — Progress Notes (Addendum)
? ? ? ? ?HPI: Evaluate syncope and FU coronary artery disease.  Patient apparently had angioplasty of unknown vessel at [facility] in the 1980s.  Patient seen in the office January 2023 with question atrial fibrillation.  His electrocardiograms were reviewed and revealed sinus rhythm with PACs.  Patient seen in the emergency room February 2023 with near syncopal episodes.  Patient states that these episodes occurred while sitting.  He would feel lightheaded with no associated symptoms.  Symptoms would last 15 minutes to 2 hours.  He did not check his blood pressure during that time.  ER labs show BUN 16 and creatinine 0.78.  Hemoglobin 13.5.  Electrocardiogram showed sinus rhythm with PAC and PVC.  Note blood pressure at that time was 147/76.  Heart rate 54.  Patient contacted the office today with syncope and added to my schedule.  Since last seen patient had a syncopal episode this morning at 8 AM.  He states he was in the shower and became dizzy followed by frank syncope for several seconds.  He found himself on the floor.  No preceding chest pain, palpitations, nausea, dyspnea.  No incontinence or seizure activity. ? ?Current Outpatient Medications  ?Medication Sig Dispense Refill  ? amLODipine (NORVASC) 5 MG tablet     ? atorvastatin (LIPITOR) 40 MG tablet Take 40 mg by mouth.    ? Cholecalciferol (VITAMIN D) 50 MCG (2000 UT) CAPS Take 1 capsule by mouth daily.    ? fluticasone (VERAMYST) 27.5 MCG/SPRAY nasal spray Place 2 sprays into the nose daily.    ? glimepiride (AMARYL) 2 MG tablet Take by mouth.    ? metFORMIN (GLUCOPHAGE-XR) 500 MG 24 hr tablet Take 1,500 mg by mouth daily.    ? Multiple Vitamins-Minerals (ICAPS AREDS 2 PO) Take 1 tablet by mouth daily.    ? pyridOXINE (VITAMIN B-6) 100 MG tablet Take 100 mg by mouth daily.    ? vitamin B-12 (CYANOCOBALAMIN) 500 MCG tablet Take 500 mcg by mouth daily.    ? ?No current facility-administered medications for this visit.  ? ? ? ?Past Medical History:   ?Diagnosis Date  ? CAD (coronary artery disease)   ? Diabetes mellitus without complication ([place])   ? Hypercholesteremia   ? Hypertension   ? Macular degeneration   ? Pancreatitis   ? Prostate cancer ([place])   ? ? ?Past Surgical History:  ?Procedure Laterality Date  ? APPENDECTOMY    ? BRAIN SURGERY    ? CHOLECYSTECTOMY    ? KNEE SURGERY    ? PROSTATECTOMY    ? TONSILLECTOMY    ? ? ?Social History  ? ?Socioeconomic History  ? Marital status: Married  ?  Spouse name: Not on file  ? Number of children: 2  ? Years of education: Not on file  ? Highest education level: Not on file  ?Occupational History  ? Not on file  ?Tobacco Use  ? Smoking status: Former  ?  Types: Cigarettes  ? Smokeless tobacco: Never  ?Substance and Sexual Activity  ? Alcohol use: Yes  ?  Comment: Occasional  ? Drug use: Not on file  ? Sexual activity: Not on file  ?Other Topics Concern  ? Not on file  ?Social History Narrative  ? Not on file  ? ?Social Determinants of Health  ? ?Financial Resource Strain: Not on file  ?Food Insecurity: Not on file  ?Transportation Needs: Not on file  ?Physical Activity: Not on file  ?Stress: Not on file  ?Social  Connections: Not on file  ?Intimate Partner Violence: Not on file  ? ? ?Family History  ?Problem Relation Age of Onset  ? Heart attack Brother   ? ? ?ROS: no fevers or chills, productive cough, hemoptysis, dysphasia, odynophagia, melena, hematochezia, dysuria, hematuria, rash, seizure activity, orthopnea, PND, pedal edema, claudication. Remaining systems are negative. ? ?Physical Exam: ?Well-developed well-nourished in no acute distress.  ?Skin is warm and dry.  ?HEENT is normal.  Normal eyelids ?Neck is supple.  No JVD ?Chest is clear to auscultation with normal expansion.  ?Cardiovascular exam is regular rate and rhythm.  No murmur ?Abdominal exam nontender or distended. No masses palpated. ?Extremities show no edema. ?neuro grossly intact ?Psych-normal affect ? ?ECG-sinus with PACs, RV conduction delay.   Personally reviewed ? ?A/P ? ?1 syncope-etiology unclear.  His blood pressure was low today and this certainly could be the cause.  However he was seen in the emergency room in February with similar episodes and his blood pressure was normal.  I will admit to telemetry (note patient has no conduction abnormalities on ECG).  Follow for any arrhythmias.  We will arrange echocardiogram tomorrow morning to assess LV function.  If no significant arrhythmias on monitor and LV function normal we will plan outpatient event monitor.  I would plan to discontinue amlodipine as he is hypotensive today.  Follow blood pressure and adjust medications as needed.  I have instructed the patient not to drive. ? ?2 coronary artery disease-patient denies chest pain.  Patient has an allergy to aspirin.  He also previously declined Plavix as he states he bleeds freely and was hesitant to take other medications.  We will continue statin. ? ?3 hypertension-blood pressure low today.  We will hold amlodipine and follow. ? ?4 hyperlipidemia-continue statin. ? ?Kirk Ruths, MD ? ? ? ?

## 2021-08-05 NOTE — Telephone Encounter (Signed)
? ?  Sending high priority  ?

## 2021-08-05 NOTE — ED Provider Notes (Signed)
?[facility] ?Provider Note ? ? ?CSN: [id number] ?Arrival date & time: 08/05/21  1649 ? ?  ? ?History ? ?Chief Complaint  ?Patient presents with  ? Atrial Fibrillation  ? Loss of Consciousness  ? ? ?Vincent Howell is a 88 y.o. male. ? ? ?Atrial Fibrillation ? ?Loss of Consciousness ?Patient presents following a syncopal episode this morning.  This occurred while he was in the shower.  He had a prodrome of dizziness.  At the time, he lowered himself to the ground slowly.  He does not believe he injured anything during this episode.  He believes that he was unconscious for several seconds.  Patient went and saw his cardiologist, Dr. Stanford Breed in the office today.  At his outpatient appointment, there was concern of hypotension.  He was instructed to come to the hospital for admission and telemetry monitoring.  Patient currently found to be in atrial fibrillation with RVR.  He denies any current symptoms despite his rapid heart rate.  He denies any known history of A-fib, although it was suspected in the past.  He is not currently on any AV nodal agents or any blood thinners. ?  ? ?Home Medications ?Prior to Admission medications   ?Medication Sig Start Date End Date Taking? Authorizing Provider  ?amLODipine (NORVASC) 5 MG tablet Take 5 mg by mouth daily. 03/02/16  Yes [provider]  ?atorvastatin (LIPITOR) 40 MG tablet Take 40 mg by mouth daily.   Yes [provider]  ?Cholecalciferol (VITAMIN D) 50 MCG (2000 UT) CAPS Take 1 capsule by mouth daily.   Yes [provider]  ?fluticasone (VERAMYST) 27.5 MCG/SPRAY nasal spray Place 2 sprays into the nose daily.   Yes [provider]  ?glimepiride (AMARYL) 2 MG tablet Take 2 mg by mouth daily with breakfast. 11/15/20  Yes [provider]  ?metFORMIN (GLUCOPHAGE-XR) 500 MG 24 hr tablet Take 1,500 mg by mouth daily. 03/27/21  Yes [provider]  ?Multiple Vitamins-Minerals (ICAPS AREDS  2 PO) Take 1 tablet by mouth daily.   Yes [provider]  ?pyridOXINE (VITAMIN B-6) 100 MG tablet Take 100 mg by mouth daily.   Yes [provider]  ?vitamin B-12 (CYANOCOBALAMIN) 500 MCG tablet Take 500 mcg by mouth daily.   Yes [provider]  ?   ? ?Allergies    ?Aspirin; Penicillins; Acetaminophen; 2,4-d dimethylamine; Benzoin; and Other   ? ?Review of Systems   ?Review of Systems  ?Cardiovascular:  Positive for syncope.  ?All other systems reviewed and are negative. ? ?Physical Exam ?Updated Vital Signs ?BP 131/70   Pulse 72   Temp 97.8 ?F (36.6 ?C) (Oral)   Resp 16   Ht '5\' 9"'$  (1.753 m)   Wt 72.8 kg   SpO2 96%   BMI 23.70 kg/m?  ?Physical Exam ?Vitals and nursing note reviewed.  ?Constitutional:   ?   General: He is not in acute distress. ?   Appearance: Normal appearance. He is well-developed and normal weight. He is not toxic-appearing or diaphoretic.  ?HENT:  ?   Head: Normocephalic and atraumatic.  ?   Right Ear: External ear normal.  ?   Left Ear: External ear normal.  ?   Mouth/Throat:  ?   Mouth: Mucous membranes are moist.  ?   Pharynx: Oropharynx is clear.  ?Eyes:  ?   Extraocular Movements: Extraocular movements intact.  ?   Conjunctiva/sclera: Conjunctivae normal.  ?Cardiovascular:  ?   Rate  and Rhythm: Tachycardia present. Rhythm irregular.  ?   Heart sounds: No murmur heard. ?Pulmonary:  ?   Effort: Pulmonary effort is normal. No respiratory distress.  ?   Breath sounds: Normal breath sounds. No wheezing or rales.  ?Chest:  ?   Chest wall: No tenderness.  ?Abdominal:  ?   Palpations: Abdomen is soft.  ?   Tenderness: There is no abdominal tenderness.  ?Musculoskeletal:     ?   General: No swelling. Normal range of motion.  ?   Cervical back: Normal range of motion and neck supple.  ?   Right lower leg: No edema.  ?   Left lower leg: No edema.  ?Skin: ?   General: Skin is warm and dry.  ?   Capillary Refill: Capillary refill takes less than 2 seconds.  ?    Coloration: Skin is not jaundiced or pale.  ?Neurological:  ?   General: No focal deficit present.  ?   Mental Status: He is alert and oriented to person, place, and time.  ?   Cranial Nerves: No cranial nerve deficit.  ?   Sensory: No sensory deficit.  ?   Motor: No weakness.  ?   Coordination: Coordination normal.  ?Psychiatric:     ?   Mood and Affect: Mood normal.     ?   Behavior: Behavior normal.     ?   Thought Content: Thought content normal.     ?   Judgment: Judgment normal.  ? ? ?ED Results / Procedures / Treatments   ?Labs ?(all labs ordered are listed, but only abnormal results are displayed) ?Labs Reviewed  ?COMPREHENSIVE METABOLIC PANEL - Abnormal; Notable for the following components:  ?    Result Value  ? Glucose, Bld 139 (*)   ? BUN 25 (*)   ? Creatinine, Ser 1.26 (*)   ? GFR, Estimated 56 (*)   ? All other components within normal limits  ?HEMOGLOBIN A1C - Abnormal; Notable for the following components:  ? Hgb A1c MFr Bld 6.2 (*)   ? All other components within normal limits  ?BASIC METABOLIC PANEL - Abnormal; Notable for the following components:  ? Glucose, Bld 115 (*)   ? BUN 24 (*)   ? All other components within normal limits  ?GLUCOSE, CAPILLARY - Abnormal; Notable for the following components:  ? Glucose-Capillary 182 (*)   ? All other components within normal limits  ?CBG MONITORING, ED - Abnormal; Notable for the following components:  ? Glucose-Capillary 170 (*)   ? All other components within normal limits  ?CBG MONITORING, ED - Abnormal; Notable for the following components:  ? Glucose-Capillary 122 (*)   ? All other components within normal limits  ?SARS CORONAVIRUS 2 BY RT PCR (HOSPITAL ORDER, [facility])  ?MAGNESIUM  ?TSH  ?T4, FREE  ?CBC WITH DIFFERENTIAL/PLATELET  ?LIPID PANEL  ?CBC  ?CBC  ?HEPARIN LEVEL (UNFRACTIONATED)  ?TROPONIN I (HIGH SENSITIVITY)  ?TROPONIN I (HIGH SENSITIVITY)  ? ? ?EKG ?EKG Interpretation ? ?Date/Time:  Tuesday August 05 2021  21:40:54 EDT ?Ventricular Rate:  76 ?PR Interval:  129 ?QRS Duration: 123 ?QT Interval:  478 ?QTC Calculation: 505 ?R Axis:   15 ?Text Interpretation: Sinus rhythm Supraventricular bigeminy IVCD, consider atypical RBBB When compared with ECG of EARLIER SAME DATE Premature atrial complexes are now present Confirmed by Delora Fuel ([id number]) on 08/06/2021 5:17:08 AM ? ?Radiology ?Portable chest x-ray 1 view ? ?Result Date: 08/05/2021 ?  CLINICAL DATA:  Intermittent episodes of lightheadedness x1 month. Syncopal event today. EXAM: PORTABLE CHEST 1 VIEW COMPARISON:  December 09, 2007 FINDINGS: The heart size and mediastinal contours are within normal limits. Right costophrenic angle is obscured by collimation. No focal airspace consolidation. No visible pleural effusion or pneumothorax. The visualized skeletal structures are unremarkable. IMPRESSION: No acute cardiopulmonary disease. Electronically Signed   By: Dahlia Bailiff M.D.   On: 08/05/2021 17:56  ? ?ECHOCARDIOGRAM COMPLETE ? ?Result Date: 08/06/2021 ?   ECHOCARDIOGRAM REPORT   Patient Name:   Vincent Howell Date of Exam: 08/06/2021 Medical Rec #:  [id number]       Height:       69.5 in Accession #:    [id number]      Weight:       163.0 lb Date of Birth:  02/28/1933        BSA:          1.903 m? Patient Age:    31 years        BP:           144/67 mmHg Patient Gender: M               HR:           59 bpm. Exam Location:  Inpatient Procedure: 2D Echo Indications:    syncope  History:        Patient has no prior history of Echocardiogram examinations.                 Abnormal ECG; Arrythmias:Atrial Fibrillation and PVC.  Sonographer:    Johny Chess RDCS Referring Phys: [place]  1. Left ventricular ejection fraction, by estimation, is 35 to 40%. The left ventricle has moderately decreased function. The left ventricle demonstrates regional wall motion abnormalities (see scoring diagram/findings for description). Left ventricular  diastolic parameters  are consistent with Grade II diastolic dysfunction (pseudonormalization).  2. Right ventricular systolic function is normal. The right ventricular size is normal. Tricuspid regurgitation signal is inadequate for asses

## 2021-08-05 NOTE — Telephone Encounter (Signed)
?  Pt c/o Syncope: STAT if syncope occurred within 30 minutes and pt complains of lightheadedness ?High Priority if episode of passing out, completely, today or in last 24 hours  ? ?Did you pass out today? Yes  ? ?When is the last time you passed out? 8 am  ? ?Has this occurred multiple times? No  ? ?Did you have any symptoms prior to passing out? Lightheaded ? ?Pt took his after passing out BP 88/66 2nd time 88/56.  ?

## 2021-08-05 NOTE — ED Provider Triage Note (Signed)
AndEmergency Medicine Provider Triage Evaluation Note ? ?Vincent Howell , a 88 y.o. male  was evaluated in triage.  She states this morning he was in the shower when he started feeling lightheaded.  Had a syncopal episode.  When he woke up, he did not have any postictal symptoms.  He says he has otherwise been feeling fine but he is still having lightheadedness.  He has never had a syncopal episode before.  He did have a couple episodes of lightheadedness about a month ago for which she was seen at one of our mid centers and had a normal work-up.  He was seen by his cardiologist today and found to be in A-fib RVR.  He has never had a history of this.  They sent him to the emergency department to likely be admitted for high risk syncope and A-fib RVR. ?Review of Systems  ?Positive:  ?Negative:  ? ?Physical Exam  ?BP 113/74 (BP Location: Left Arm)   Pulse 77   Resp 18   SpO2 95%  ?Gen:   Awake, no distress   ?Resp:  Normal effort  ?MSK:   Moves extremities without difficulty  ?Other:   ? ?Medical Decision Making  ?Medically screening exam initiated at 5:10 PM.  Appropriate orders placed.  Vincent Howell was informed that the remainder of the evaluation will be completed by another provider, this initial triage assessment does not replace that evaluation, and the importance of remaining in the ED until their evaluation is complete. ? ? ?  ?Adolphus Birchwood, PA-C ?08/05/21 1711 ? ?

## 2021-08-06 ENCOUNTER — Observation Stay (HOSPITAL_COMMUNITY): Payer: Medicare Other

## 2021-08-06 ENCOUNTER — Other Ambulatory Visit (HOSPITAL_COMMUNITY): Payer: Medicare Other

## 2021-08-06 DIAGNOSIS — Z87891 Personal history of nicotine dependence: Secondary | ICD-10-CM | POA: Diagnosis not present

## 2021-08-06 DIAGNOSIS — Z20822 Contact with and (suspected) exposure to covid-19: Secondary | ICD-10-CM | POA: Diagnosis present

## 2021-08-06 DIAGNOSIS — I493 Ventricular premature depolarization: Secondary | ICD-10-CM | POA: Diagnosis present

## 2021-08-06 DIAGNOSIS — I9589 Other hypotension: Secondary | ICD-10-CM | POA: Diagnosis present

## 2021-08-06 DIAGNOSIS — I480 Paroxysmal atrial fibrillation: Secondary | ICD-10-CM | POA: Diagnosis present

## 2021-08-06 DIAGNOSIS — I2510 Atherosclerotic heart disease of native coronary artery without angina pectoris: Secondary | ICD-10-CM | POA: Insufficient documentation

## 2021-08-06 DIAGNOSIS — E7800 Pure hypercholesterolemia, unspecified: Secondary | ICD-10-CM | POA: Diagnosis present

## 2021-08-06 DIAGNOSIS — Z8546 Personal history of malignant neoplasm of prostate: Secondary | ICD-10-CM | POA: Diagnosis not present

## 2021-08-06 DIAGNOSIS — I5041 Acute combined systolic (congestive) and diastolic (congestive) heart failure: Secondary | ICD-10-CM | POA: Diagnosis present

## 2021-08-06 DIAGNOSIS — E119 Type 2 diabetes mellitus without complications: Secondary | ICD-10-CM | POA: Diagnosis present

## 2021-08-06 DIAGNOSIS — I110 Hypertensive heart disease with heart failure: Secondary | ICD-10-CM | POA: Diagnosis present

## 2021-08-06 DIAGNOSIS — Z7901 Long term (current) use of anticoagulants: Secondary | ICD-10-CM | POA: Diagnosis not present

## 2021-08-06 DIAGNOSIS — R55 Syncope and collapse: Secondary | ICD-10-CM | POA: Diagnosis present

## 2021-08-06 DIAGNOSIS — I429 Cardiomyopathy, unspecified: Secondary | ICD-10-CM | POA: Diagnosis not present

## 2021-08-06 DIAGNOSIS — Z79899 Other long term (current) drug therapy: Secondary | ICD-10-CM | POA: Diagnosis not present

## 2021-08-06 DIAGNOSIS — I472 Ventricular tachycardia, unspecified: Secondary | ICD-10-CM | POA: Diagnosis not present

## 2021-08-06 DIAGNOSIS — I4891 Unspecified atrial fibrillation: Secondary | ICD-10-CM

## 2021-08-06 DIAGNOSIS — Z7984 Long term (current) use of oral hypoglycemic drugs: Secondary | ICD-10-CM | POA: Diagnosis not present

## 2021-08-06 DIAGNOSIS — Z8249 Family history of ischemic heart disease and other diseases of the circulatory system: Secondary | ICD-10-CM | POA: Diagnosis not present

## 2021-08-06 LAB — ECHOCARDIOGRAM COMPLETE
Area-P 1/2: 3.06 cm2
Height: 69.5 in
S' Lateral: 4.5 cm
Weight: 2608 oz

## 2021-08-06 LAB — CBC
HCT: 39.6 % (ref 39.0–52.0)
Hemoglobin: 13.2 g/dL (ref 13.0–17.0)
MCH: 30.6 pg (ref 26.0–34.0)
MCHC: 33.3 g/dL (ref 30.0–36.0)
MCV: 91.7 fL (ref 80.0–100.0)
Platelets: 169 10*3/uL (ref 150–400)
RBC: 4.32 MIL/uL (ref 4.22–5.81)
RDW: 14 % (ref 11.5–15.5)
WBC: 7 10*3/uL (ref 4.0–10.5)
nRBC: 0 % (ref 0.0–0.2)

## 2021-08-06 LAB — LIPID PANEL
Cholesterol: 107 mg/dL (ref 0–200)
HDL: 46 mg/dL (ref >40)
LDL Cholesterol: 50 mg/dL (ref 0–99)
Total CHOL/HDL Ratio: 2.3 RATIO
Triglycerides: 55 mg/dL (ref <150)
VLDL: 11 mg/dL (ref 0–40)

## 2021-08-06 LAB — BASIC METABOLIC PANEL
Anion gap: 9 (ref 5–15)
BUN: 24 mg/dL — ABNORMAL HIGH (ref 8–23)
CO2: 26 mmol/L (ref 22–32)
Calcium: 9.2 mg/dL (ref 8.9–10.3)
Chloride: 104 mmol/L (ref 98–111)
Creatinine, Ser: 1 mg/dL (ref 0.61–1.24)
GFR, Estimated: >60 mL/min (ref >60)
Glucose, Bld: 115 mg/dL — ABNORMAL HIGH (ref 70–99)
Potassium: 4.4 mmol/L (ref 3.5–5.1)
Sodium: 139 mmol/L (ref 135–145)

## 2021-08-06 LAB — GLUCOSE, CAPILLARY
Glucose-Capillary: 182 mg/dL — ABNORMAL HIGH (ref 70–99)
Glucose-Capillary: 87 mg/dL (ref 70–99)
Glucose-Capillary: 116 mg/dL — ABNORMAL HIGH (ref 70–99)

## 2021-08-06 LAB — HEPARIN LEVEL (UNFRACTIONATED): Heparin Unfractionated: 0.47 IU/mL (ref 0.30–0.70)

## 2021-08-06 LAB — CBG MONITORING, ED: Glucose-Capillary: 122 mg/dL — ABNORMAL HIGH (ref 70–99)

## 2021-08-06 MED ORDER — SODIUM CHLORIDE 0.9 % IV SOLN: INTRAVENOUS | Status: DC

## 2021-08-06 MED ORDER — HEPARIN (PORCINE) 25000 UT/250ML-% IV SOLN
1100.0000 [IU]/h | INTRAVENOUS | Status: DC
  Administered 2021-08-06: 1100 [IU]/h via INTRAVENOUS
  Filled 2021-08-06: qty 250

## 2021-08-06 MED ORDER — SODIUM CHLORIDE 0.9 % IV SOLN: 250.0000 mL | INTRAVENOUS | Status: DC | PRN

## 2021-08-06 MED ORDER — SODIUM CHLORIDE 0.9% FLUSH
3.0000 mL | Freq: Two times a day (BID) | INTRAVENOUS | Status: DC
  Administered 2021-08-06 – 2021-08-07 (×3): 3 mL via INTRAVENOUS

## 2021-08-06 MED ORDER — ASPIRIN 81 MG PO CHEW: 81.0000 mg | CHEWABLE_TABLET | ORAL | Status: DC

## 2021-08-06 MED ORDER — SODIUM CHLORIDE 0.9% FLUSH
3.0000 mL | INTRAVENOUS | Status: DC | PRN
Start: 2021-08-06 — End: 2021-08-07

## 2021-08-06 MED ORDER — AMIODARONE HCL 200 MG PO TABS
200.0000 mg | ORAL_TABLET | Freq: Two times a day (BID) | ORAL | Status: DC
  Administered 2021-08-06 – 2021-08-08 (×4): 200 mg via ORAL
  Filled 2021-08-06 (×4): qty 1

## 2021-08-06 NOTE — ED Notes (Signed)
Provider at bedside

## 2021-08-06 NOTE — ED Notes (Signed)
ED TO INPATIENT HANDOFF REPORT ? ?ED Nurse Name and Phone #: Baxter Flattery, RN ? ?S ?Name/Age/Gender ?Vincent Howell ?88 y.o. ?male ?Room/Bed: 023C/023C ? ?Code Status ?  Code Status: Full Code ? ?Home/SNF/Other ?Home ?Patient oriented to: self, place, time, and situation ?Is this baseline? Yes  ? ?Triage Complete: Triage complete  ?Chief Complaint ?Syncope [R55] ? ?Triage Note ?Pt here with reports of having intermittent episodes of lightheadedness for the last month. Today pt with syncopal event. Went to cardiologist today who sent pt over here. Pt in afib rvr, no history.   ? ?Allergies ?Allergies  ?Allergen Reactions  ? Aspirin Anaphylaxis  ? Penicillins Anaphylaxis  ? Acetaminophen Other (See Comments)  ?  Unknown  ? 2,4-D Dimethylamine Rash  ? Benzoin Itching and Rash  ? Other Rash  ?  Silk tape  ? ? ?Level of Care/Admitting Diagnosis ?ED Disposition   ? ? ED Disposition  ?Admit  ? Condition  ?--  ? Comment  ?Hospital Area: [facility] [[id number]] ? Level of Care: Progressive [102] ? Admit to Progressive based on following criteria: CARDIOVASCULAR & THORACIC of moderate stability with acute coronary syndrome symptoms/low risk myocardial infarction/hypertensive urgency/arrhythmias/heart failure potentially compromising stability and stable post cardiovascular intervention patients. ? May place patient in observation at [facility] or [facility] if equivalent level of care is available:: No ? Covid Evaluation: Asymptomatic - no recent exposure (last 10 days) testing not required ? Diagnosis: Syncope [206001] ? Admitting Physician: [name] [place] ? Attending Physician: [name] [[id number]] ?  ?  ? ?  ? ? ?B ?Medical/Surgery History ?Past Medical History:  ?Diagnosis Date  ? CAD (coronary artery disease)   ? Diabetes mellitus without complication ([place])   ? Hypercholesteremia   ? Hypertension   ? Macular degeneration   ? Pancreatitis   ? Prostate cancer ([place])   ? ?Past Surgical History:   ?Procedure Laterality Date  ? APPENDECTOMY    ? BRAIN SURGERY    ? CHOLECYSTECTOMY    ? KNEE SURGERY    ? PROSTATECTOMY    ? TONSILLECTOMY    ?  ? ?A ?IV Location/Drains/Wounds ?Patient Lines/Drains/Airways Status   ? ? Active Line/Drains/Airways   ? ? Name Placement date Placement time Site Days  ? Peripheral IV 08/05/21 20 G Right Antecubital 08/05/21  1741  Antecubital  1  ? ?  ?  ? ?  ? ? ?Intake/Output Last 24 hours ? ?Intake/Output Summary (Last 24 hours) at 08/06/2021 0936 ?Last data filed at 08/06/2021 6644 ?Gross per 24 hour  ?Intake 568.35 ml  ?Output --  ?Net 568.35 ml  ? ? ?Labs/Imaging ?Results for orders placed or performed during the hospital encounter of 08/05/21 (from the past 48 hour(s))  ?Magnesium     Status: None  ? Collection Time: 08/05/21  5:07 PM  ?Result Value Ref Range  ? Magnesium 1.9 1.7 - 2.4 mg/dL  ?  Comment: Performed at [facility], [place] [address], [place], [place] [address]  ?TSH     Status: None  ? Collection Time: 08/05/21  5:07 PM  ?Result Value Ref Range  ? TSH 3.770 0.350 - 4.500 uIU/mL  ?  Comment: Performed by a 3rd Generation assay with a functional sensitivity of <=0.01 uIU/mL. ?Performed at [facility], [place] [address], [place], [place] [address] ?  ?Comprehensive metabolic panel     Status: Abnormal  ? Collection Time: 08/05/21  5:07 PM  ?Result  Value Ref Range  ? Sodium 138 135 - 145 mmol/L  ? Potassium 4.5 3.5 - 5.1 mmol/L  ? Chloride 105 98 - 111 mmol/L  ? CO2 23 22 - 32 mmol/L  ? Glucose, Bld 139 (H) 70 - 99 mg/dL  ?  Comment: Glucose reference range applies only to samples taken after fasting for at least 8 hours.  ? BUN 25 (H) 8 - 23 mg/dL  ? Creatinine, Ser 1.26 (H) 0.61 - 1.24 mg/dL  ? Calcium 9.5 8.9 - 10.3 mg/dL  ? Total Protein 7.6 6.5 - 8.1 g/dL  ? Albumin 4.1 3.5 - 5.0 g/dL  ? AST 24 15 - 41 U/L  ? ALT 19 0 - 44 U/L  ? Alkaline Phosphatase 74 38 - 126 U/L  ? Total Bilirubin 1.1 0.3 - 1.2 mg/dL  ? GFR, Estimated 56 (L) >60 mL/min  ?  Comment:  (NOTE) ?Calculated using the CKD-EPI Creatinine Equation (2021) ?  ? Anion gap 10 5 - 15  ?  Comment: Performed at [facility], [place] [address] [place], [place] [address]  ?T4, free     Status: None  ? Collection Time: 08/05/21  5:07 PM  ?Result Value Ref Range  ? Free T4 0.76 0.61 - 1.12 ng/dL  ?  Comment: (NOTE) ?Biotin ingestion may interfere with free T4 tests. If the results are ?inconsistent with the TSH level, previous test results, or the ?clinical presentation, then consider biotin interference. If needed, ?order repeat testing after stopping biotin. ?Performed at [facility], [place] [address], [place], [place] ?[address] ?  ?Troponin I (High Sensitivity)     Status: None  ? Collection Time: 08/05/21  5:07 PM  ?Result Value Ref Range  ? Troponin I (High Sensitivity) 15 <18 ng/L  ?  Comment: (NOTE) ?Elevated high sensitivity troponin I (hsTnI) values and significant  ?changes across serial measurements may suggest ACS but many other  ?chronic and acute conditions are known to elevate hsTnI results.  ?Refer to the "Links" section for chest pain algorithms and additional  ?guidance. ?Performed at [facility], [place] [address] [place], [place] ?[address] ?  ?CBC WITH DIFFERENTIAL     Status: None  ? Collection Time: 08/05/21  5:07 PM  ?Result Value Ref Range  ? WBC 5.5 4.0 - 10.5 K/uL  ? RBC 4.99 4.22 - 5.81 MIL/uL  ? Hemoglobin 15.0 13.0 - 17.0 g/dL  ? HCT 46.1 39.0 - 52.0 %  ? MCV 92.4 80.0 - 100.0 fL  ? MCH 30.1 26.0 - 34.0 pg  ? MCHC 32.5 30.0 - 36.0 g/dL  ? RDW 13.9 11.5 - 15.5 %  ? Platelets 194 150 - 400 K/uL  ? nRBC 0.0 0.0 - 0.2 %  ? Neutrophils Relative % 63 %  ? Neutro Abs 3.5 1.7 - 7.7 K/uL  ? Lymphocytes Relative 20 %  ? Lymphs Abs 1.1 0.7 - 4.0 K/uL  ? Monocytes Relative 15 %  ? Monocytes Absolute 0.9 0.1 - 1.0 K/uL  ? Eosinophils Relative 1 %  ? Eosinophils Absolute 0.0 0.0 - 0.5 K/uL  ? Basophils Relative 1 %  ? Basophils Absolute 0.0 0.0 - 0.1 K/uL  ? Immature Granulocytes 0 %   ? Abs Immature Granulocytes 0.01 0.00 - 0.07 K/uL  ?  Comment: Performed at [facility], [place] [address] [place], [place] [address]  ?Hemoglobin A1c     Status: Abnormal  ? Collection Time: 08/05/21  5:09 PM  ?Result Value Ref Range  ? Hgb A1c MFr Bld 6.2 (H) 4.8 - 5.6 %  ?  Comment: (NOTE) ?Pre diabetes:          5.7%-6.4% ? ?Diabetes:              >6.4% ? ?Glycemic control for   <7.0% ?adults with diabetes ?  ? Mean Plasma Glucose 131.24 mg/dL  ?  Comment: Performed at [facility], [place] [address], [place], [place] [address]  ?SARS Coronavirus 2 by RT PCR (hospital order, performed in [facility] hospital lab) Nasopharyngeal Nasopharyngeal Swab     Status: None  ? Collection Time: 08/05/21  8:09 PM  ? Specimen: Nasopharyngeal Swab  ?Result Value Ref Range  ? SARS Coronavirus 2 NEGATIVE NEGATIVE  ?  Comment: (NOTE) ?SARS-CoV-2 target nucleic acids are NOT DETECTED. ? ?The SARS-CoV-2 RNA is generally detectable in upper and lower ?respiratory specimens during the acute phase of infection. The lowest ?concentration of SARS-CoV-2 viral copies this assay can detect is 250 ?copies / mL. A negative result does not preclude SARS-CoV-2 infection ?and should not be used as the sole basis for treatment or other ?patient management decisions.  A negative result may occur with ?improper specimen collection / handling, submission of specimen other ?than nasopharyngeal swab, presence of viral mutation(s) within the ?areas targeted by this assay, and inadequate number of viral copies ?(<250 copies / mL). A negative result must be combined with clinical ?observations, patient history, and epidemiological information. ? ?Fact Sheet for Patients:   ?StrictlyIdeas.no ? ?Fact Sheet for Healthcare Providers: ?BankingDealers.co.za ? ?This test is not yet approved or  cleared by the [place] FDA and ?has been authorized for detection and/or diagnosis of SARS-CoV-2 by ?FDA  under an Emergency Use Authorization (EUA).  This EUA will remain ?in effect (meaning this test can be used) for the duration of the ?COVID-19 declaration under Section 564(b)(1) of the Act, 21 U.S.C. ?section 36

## 2021-08-06 NOTE — Progress Notes (Signed)
ANTICOAGULATION CONSULT NOTE ? ?Pharmacy Consult for Heparin ?Indication: atrial fibrillation ? ?Allergies  ?Allergen Reactions  ? Aspirin Anaphylaxis  ? Penicillins Anaphylaxis  ? Acetaminophen Other (See Comments)  ?  Unknown  ? 2,4-D Dimethylamine Rash  ? Benzoin Itching and Rash  ? Other Rash  ?  Silk tape  ? ? ?Patient Measurements: ?Height: '5\' 9"'$  (175.3 cm) ?Weight: 72.8 kg (160 lb 8 oz) ?IBW/kg (Calculated) : 70.7 ? ?Heparin Dosing Weight: 74 kg ? ?Vital Signs: ?Temp: 97.8 ?F (36.6 ?C) (03/29 1047) ?Temp Source: Oral (03/29 1047) ?BP: 131/70 (03/29 1409) ?Pulse Rate: 72 (03/29 1409) ? ?Labs: ?Recent Labs  ?  08/05/21 ?1707 08/05/21 ?2144 08/06/21 ?1497 08/06/21 ?1829  ?HGB 15.0  --  13.2  --   ?HCT 46.1  --  39.6  --   ?PLT 194  --  169  --   ?HEPARINUNFRC  --   --   --  0.47  ?CREATININE 1.26*  --  1.00  --   ?TROPONINIHS 15 15  --   --   ? ? ?Estimated Creatinine Clearance: 53 mL/min (by C-G formula based on SCr of 1 mg/dL). ? ? ?Assessment: ?89 yoM admitted with syncope and AFib RVR. Pharmacy asked to start IV heparin. Pt was not on AC PTA. CBC wnl, platelets 169. Heparin level in range at 0.47.  ? ? ?Goal of Therapy:  ?Heparin level 0.3-0.7 units/ml ?Monitor platelets by anticoagulation protocol: Yes ?  ?Plan:  ?Continue heparin infusion at 1100 units/hr ?Check heparin level in 8 hours and daily while on heparin ?Continue to monitor H&H and platelets ? ? ? ?Thank you for allowing pharmacy to be a part of this patient?s care. ? ?Ardyth Harps, PharmD ?Clinical Pharmacist ? ?

## 2021-08-06 NOTE — Progress Notes (Signed)
ANTICOAGULATION CONSULT NOTE ? ?Pharmacy Consult for heparin ?Indication: atrial fibrillation ? ?Allergies  ?Allergen Reactions  ? Aspirin Anaphylaxis  ? Penicillins Anaphylaxis  ? Acetaminophen Other (See Comments)  ?  Unknown  ? 2,4-D Dimethylamine Rash  ? Benzoin Itching and Rash  ? Other Rash  ?  Silk tape  ? ? ?Patient Measurements: ?  ?Heparin Dosing Weight: 74kg ? ?Vital Signs: ?Temp: 98.5 ?F (36.9 ?C) (03/28 2245) ?Temp Source: Oral (03/28 2245) ?BP: 144/67 (03/29 0900) ?Pulse Rate: 53 (03/29 0900) ? ?Labs: ?Recent Labs  ?  08/05/21 ?1707 08/05/21 ?2144 08/06/21 ?5364  ?HGB 15.0  --  13.2  ?HCT 46.1  --  39.6  ?PLT 194  --  169  ?CREATININE 1.26*  --  1.00  ?TROPONINIHS 15 15  --   ? ? ?Estimated Creatinine Clearance: 53.9 mL/min (by C-G formula based on SCr of 1 mg/dL). ? ? ?Medical History: ?Past Medical History:  ?Diagnosis Date  ? CAD (coronary artery disease)   ? Diabetes mellitus without complication ([place])   ? Hypercholesteremia   ? Hypertension   ? Macular degeneration   ? Pancreatitis   ? Prostate cancer ([place])   ? ?Assessment: ?51 yoM admitted with syncope and AFib RVR. Pharmacy asked to start IV heparin. Pt is not on AC PTA, CBC wnl. SQ heparin given at 0730 for DVT ppx so will defer bolus. ? ?Goal of Therapy:  ?Heparin level 0.3-0.7 units/ml ?Monitor platelets by anticoagulation protocol: Yes ?  ?Plan:  ?Heparin 1100 units/h no bolus ?Check heparin level in 8h ? ?Arrie Senate, PharmD, BCPS, BCCP ?Clinical Pharmacist ?[phone] ?Please check AMION for all [place] numbers ?08/06/2021 ? ? ? ?

## 2021-08-06 NOTE — Progress Notes (Signed)
?  Echocardiogram ?2D Echocardiogram has been performed. ? ?Vincent Howell ?08/06/2021, 9:46 AM ?

## 2021-08-06 NOTE — H&P (View-Only) (Signed)
? ?Progress Note ? ?Patient Name: Vincent Howell [facility] ?Date of Encounter: 08/06/2021 ? ?[facility] Cardiologist: Kirk Ruths, MD  ? ?Subjective  ? ?BP 144/67 this morning.  Creatinine 1.0.  Denies any chest pain or dyspnea.  Denies any further lightheadedness or syncope ? ?Inpatient Medications  ?  ?Scheduled Meds: ? atorvastatin  40 mg Oral Daily  ? heparin  5,000 Units Subcutaneous Q8H  ? heparin  5,000 Units Subcutaneous Q8H  ? insulin aspart  0-5 Units Subcutaneous QHS  ? insulin aspart  0-6 Units Subcutaneous TID WC  ? pyridOXINE  100 mg Oral Daily  ? ?Continuous Infusions: ? amiodarone 30 mg/hr (08/06/21 0728)  ? ?PRN Meds: ?acetaminophen, nitroGLYCERIN, ondansetron (ZOFRAN) IV  ? ?Vital Signs  ?  ?Vitals:  ? 08/06/21 0730 08/06/21 0800 08/06/21 0830 08/06/21 0900  ?BP: 121/74 128/69 134/78 (!) 144/67  ?Pulse: (!) 56 (!) 53 (!) 34 (!) 53  ?Resp: '19 20 20 16  '$ ?Temp:      ?TempSrc:      ?SpO2: 93% 93% 96% 96%  ? ? ?Intake/Output Summary (Last 24 hours) at 08/06/2021 0906 ?Last data filed at 08/06/2021 8003 ?Gross per 24 hour  ?Intake 568.35 ml  ?Output --  ?Net 568.35 ml  ? ? ?  08/05/2021  ?  3:32 PM 05/20/2021  ?  3:37 PM  ?Last 3 Weights  ?Weight (lbs) 163 lb 171 lb 3.2 oz  ?Weight (kg) 73.936 kg 77.656 kg  ?   ? ?Telemetry  ?  ?A-fib with RVR to 140s, converted to normal sinus rhythm with PACs/PVCs, rate 50s to 60s- Personally Reviewed ? ?ECG  ?  ?No new ECG- Personally Reviewed ? ?Physical Exam  ? ?GEN: No acute distress.   ?Neck: No JVD ?Cardiac: RRR, no murmurs, rubs, or gallops.  ?Respiratory: Clear to auscultation bilaterally. ?GI: Soft, nontender, non-distended  ?MS: No edema; No deformity. ?Neuro:  Nonfocal  ?Psych: Normal affect  ? ?Labs  ?  ?High Sensitivity Troponin:   ?Recent Labs  ?Lab 08/05/21 ?1707 08/05/21 ?2144  ?TROPONINIHS 15 15  ?   ?Chemistry ?Recent Labs  ?Lab 08/05/21 ?1707 08/06/21 ?4917  ?NA 138 139  ?K 4.5 4.4  ?CL 105 104  ?CO2 23 26  ?GLUCOSE 139* 115*  ?BUN 25* 24*  ?CREATININE  1.26* 1.00  ?CALCIUM 9.5 9.2  ?MG 1.9  --   ?PROT 7.6  --   ?ALBUMIN 4.1  --   ?AST 24  --   ?ALT 19  --   ?ALKPHOS 74  --   ?BILITOT 1.1  --   ?GFRNONAA 56* >60  ?ANIONGAP 10 9  ?  ?Lipids  ?Recent Labs  ?Lab 08/06/21 ?9150  ?CHOL 107  ?TRIG 55  ?HDL 46  ?[place] 50  ?CHOLHDL 2.3  ?  ?Hematology ?Recent Labs  ?Lab 08/05/21 ?1707 08/06/21 ?5697  ?WBC 5.5 7.0  ?RBC 4.99 4.32  ?HGB 15.0 13.2  ?HCT 46.1 39.6  ?MCV 92.4 91.7  ?MCH 30.1 30.6  ?MCHC 32.5 33.3  ?RDW 13.9 14.0  ?PLT 194 169  ? ?Thyroid  ?Recent Labs  ?Lab 08/05/21 ?1707  ?TSH 3.770  ?FREET4 0.76  ?  ?BNPNo results for input(s): BNP, PROBNP in the last 168 hours.  ?DDimer No results for input(s): DDIMER in the last 168 hours.  ? ?Radiology  ?  ?Portable chest x-ray 1 view ? ?Result Date: 08/05/2021 ?CLINICAL DATA:  Intermittent episodes of lightheadedness x1 month. Syncopal event today. EXAM: PORTABLE CHEST 1 VIEW COMPARISON:  December 09, 2007 FINDINGS: The heart size and mediastinal contours are within normal limits. Right costophrenic angle is obscured by collimation. No focal airspace consolidation. No visible pleural effusion or pneumothorax. The visualized skeletal structures are unremarkable. IMPRESSION: No acute cardiopulmonary disease. Electronically Signed   By: Dahlia Bailiff M.D.   On: 08/05/2021 17:56   ? ?Cardiac Studies  ? ? ? ?Patient Profile  ?   ?88 y.o. male with CAD (status post angioplasty in 1980s), hypertension, hyperlipidemia, T2DM, prostate cancer who was admitted 08/05/21 with syncope. ? ?Assessment & Plan  ?  ?Syncope: Unclear cause.  Had low blood pressure yesterday which may have been contributing.  Could have been related to A-fib with RVR. ?-Check echocardiogram ?-Continue to monitor on telemetry ? ?A-fib with RVR: Found to be in A-fib and RVR on presentation to ED yesterday.  At office visit yesterday was in sinus rhythm.  Started on IV amiodarone drip and converted to sinus rhythm.  CHA2DS2-VASc score 5 (hypertension, age x2, T2DM,  CAD) ?-Start anticoagulation with heparin gtt.  If echocardiogram unremarkable, will switch to Eliquis.  If EF reduced on echo, plan for LHC/RHC and can switch to Eliquis after cath ?-Continue IV amiodarone for now, will plan to transition to p.o. likely tomorrow ? ?Addendum: ?EF 35 to 40% on echo.  Inferior/inferolateral hypokinesis.  Recommend RHC/LHC tomorrow for further evaluation.  Risks and benefits of cardiac catheterization have been discussed with the patient.  These include bleeding, infection, kidney damage, stroke, heart attack, death.  The patient understands these risks and is willing to proceed. ? ? ?CAD: Reported angioplasty in 1980s.  Has not been on aspirin.  Start anticoagulation as above.  Continue statin ? ?Hypertension: Holding home amlodipine given soft BP on admission ? ?Hyperlipidemia: LDL 50.  On atorvastatin 40 mg daily ? ?T2DM: SSI ? ?-Diet heart healthy ?-DVT PPx: Heparin drip ?-Code: Full ? ? ? ?For questions or updates, please contact [place] ?Please consult [email or web address] for contact info under  ? ?  ?   ?Signed, ?Donato Heinz, MD  ?08/06/2021, 9:06 AM   ? ?

## 2021-08-06 NOTE — Progress Notes (Addendum)
? ?Progress Note ? ?Patient Name: Vincent Howell [facility] ?Date of Encounter: 08/06/2021 ? ?[facility] Cardiologist: Kirk Ruths, MD  ? ?Subjective  ? ?BP 144/67 this morning.  Creatinine 1.0.  Denies any chest pain or dyspnea.  Denies any further lightheadedness or syncope ? ?Inpatient Medications  ?  ?Scheduled Meds: ? atorvastatin  40 mg Oral Daily  ? heparin  5,000 Units Subcutaneous Q8H  ? heparin  5,000 Units Subcutaneous Q8H  ? insulin aspart  0-5 Units Subcutaneous QHS  ? insulin aspart  0-6 Units Subcutaneous TID WC  ? pyridOXINE  100 mg Oral Daily  ? ?Continuous Infusions: ? amiodarone 30 mg/hr (08/06/21 0728)  ? ?PRN Meds: ?acetaminophen, nitroGLYCERIN, ondansetron (ZOFRAN) IV  ? ?Vital Signs  ?  ?Vitals:  ? 08/06/21 0730 08/06/21 0800 08/06/21 0830 08/06/21 0900  ?BP: 121/74 128/69 134/78 (!) 144/67  ?Pulse: (!) 56 (!) 53 (!) 34 (!) 53  ?Resp: '19 20 20 16  '$ ?Temp:      ?TempSrc:      ?SpO2: 93% 93% 96% 96%  ? ? ?Intake/Output Summary (Last 24 hours) at 08/06/2021 0906 ?Last data filed at 08/06/2021 0272 ?Gross per 24 hour  ?Intake 568.35 ml  ?Output --  ?Net 568.35 ml  ? ? ?  08/05/2021  ?  3:32 PM 05/20/2021  ?  3:37 PM  ?Last 3 Weights  ?Weight (lbs) 163 lb 171 lb 3.2 oz  ?Weight (kg) 73.936 kg 77.656 kg  ?   ? ?Telemetry  ?  ?A-fib with RVR to 140s, converted to normal sinus rhythm with PACs/PVCs, rate 50s to 60s- Personally Reviewed ? ?ECG  ?  ?No new ECG- Personally Reviewed ? ?Physical Exam  ? ?GEN: No acute distress.   ?Neck: No JVD ?Cardiac: RRR, no murmurs, rubs, or gallops.  ?Respiratory: Clear to auscultation bilaterally. ?GI: Soft, nontender, non-distended  ?MS: No edema; No deformity. ?Neuro:  Nonfocal  ?Psych: Normal affect  ? ?Labs  ?  ?High Sensitivity Troponin:   ?Recent Labs  ?Lab 08/05/21 ?1707 08/05/21 ?2144  ?TROPONINIHS 15 15  ?   ?Chemistry ?Recent Labs  ?Lab 08/05/21 ?1707 08/06/21 ?5366  ?NA 138 139  ?K 4.5 4.4  ?CL 105 104  ?CO2 23 26  ?GLUCOSE 139* 115*  ?BUN 25* 24*  ?CREATININE  1.26* 1.00  ?CALCIUM 9.5 9.2  ?MG 1.9  --   ?PROT 7.6  --   ?ALBUMIN 4.1  --   ?AST 24  --   ?ALT 19  --   ?ALKPHOS 74  --   ?BILITOT 1.1  --   ?GFRNONAA 56* >60  ?ANIONGAP 10 9  ?  ?Lipids  ?Recent Labs  ?Lab 08/06/21 ?4403  ?CHOL 107  ?TRIG 55  ?HDL 46  ?[place] 50  ?CHOLHDL 2.3  ?  ?Hematology ?Recent Labs  ?Lab 08/05/21 ?1707 08/06/21 ?4742  ?WBC 5.5 7.0  ?RBC 4.99 4.32  ?HGB 15.0 13.2  ?HCT 46.1 39.6  ?MCV 92.4 91.7  ?MCH 30.1 30.6  ?MCHC 32.5 33.3  ?RDW 13.9 14.0  ?PLT 194 169  ? ?Thyroid  ?Recent Labs  ?Lab 08/05/21 ?1707  ?TSH 3.770  ?FREET4 0.76  ?  ?BNPNo results for input(s): BNP, PROBNP in the last 168 hours.  ?DDimer No results for input(s): DDIMER in the last 168 hours.  ? ?Radiology  ?  ?Portable chest x-ray 1 view ? ?Result Date: 08/05/2021 ?CLINICAL DATA:  Intermittent episodes of lightheadedness x1 month. Syncopal event today. EXAM: PORTABLE CHEST 1 VIEW COMPARISON:  December 09, 2007 FINDINGS: The heart size and mediastinal contours are within normal limits. Right costophrenic angle is obscured by collimation. No focal airspace consolidation. No visible pleural effusion or pneumothorax. The visualized skeletal structures are unremarkable. IMPRESSION: No acute cardiopulmonary disease. Electronically Signed   By: Dahlia Bailiff M.D.   On: 08/05/2021 17:56   ? ?Cardiac Studies  ? ? ? ?Patient Profile  ?   ?88 y.o. male with CAD (status post angioplasty in 1980s), hypertension, hyperlipidemia, T2DM, prostate cancer who was admitted 08/05/21 with syncope. ? ?Assessment & Plan  ?  ?Syncope: Unclear cause.  Had low blood pressure yesterday which may have been contributing.  Could have been related to A-fib with RVR. ?-Check echocardiogram ?-Continue to monitor on telemetry ? ?A-fib with RVR: Found to be in A-fib and RVR on presentation to ED yesterday.  At office visit yesterday was in sinus rhythm.  Started on IV amiodarone drip and converted to sinus rhythm.  CHA2DS2-VASc score 5 (hypertension, age x2, T2DM,  CAD) ?-Start anticoagulation with heparin gtt.  If echocardiogram unremarkable, will switch to Eliquis.  If EF reduced on echo, plan for LHC/RHC and can switch to Eliquis after cath ?-Continue IV amiodarone for now, will plan to transition to p.o. likely tomorrow ? ?Addendum: ?EF 35 to 40% on echo.  Inferior/inferolateral hypokinesis.  Recommend RHC/LHC tomorrow for further evaluation.  Risks and benefits of cardiac catheterization have been discussed with the patient.  These include bleeding, infection, kidney damage, stroke, heart attack, death.  The patient understands these risks and is willing to proceed. ? ? ?CAD: Reported angioplasty in 1980s.  Has not been on aspirin.  Start anticoagulation as above.  Continue statin ? ?Hypertension: Holding home amlodipine given soft BP on admission ? ?Hyperlipidemia: LDL 50.  On atorvastatin 40 mg daily ? ?T2DM: SSI ? ?-Diet heart healthy ?-DVT PPx: Heparin drip ?-Code: Full ? ? ? ?For questions or updates, please contact [place] ?Please consult [email or web address] for contact info under  ? ?  ?   ?Signed, ?Donato Heinz, MD  ?08/06/2021, 9:06 AM   ? ?

## 2021-08-07 ENCOUNTER — Inpatient Hospital Stay (HOSPITAL_COMMUNITY)
Admission: EM | Disposition: A | Discharge status: Home / Self Care | Payer: Self-pay | Source: Home / Self Care | Attending: Cardiology

## 2021-08-07 ENCOUNTER — Encounter (HOSPITAL_COMMUNITY): Payer: Self-pay | Admitting: Internal Medicine

## 2021-08-07 ENCOUNTER — Other Ambulatory Visit (HOSPITAL_COMMUNITY): Payer: Self-pay

## 2021-08-07 DIAGNOSIS — I429 Cardiomyopathy, unspecified: Secondary | ICD-10-CM

## 2021-08-07 DIAGNOSIS — I5041 Acute combined systolic (congestive) and diastolic (congestive) heart failure: Secondary | ICD-10-CM

## 2021-08-07 HISTORY — PX: RIGHT/LEFT HEART CATH AND CORONARY ANGIOGRAPHY: CATH118266

## 2021-08-07 LAB — BASIC METABOLIC PANEL
Anion gap: 10 (ref 5–15)
BUN: 16 mg/dL (ref 8–23)
CO2: 24 mmol/L (ref 22–32)
Calcium: 9.2 mg/dL (ref 8.9–10.3)
Chloride: 103 mmol/L (ref 98–111)
Creatinine, Ser: 0.76 mg/dL (ref 0.61–1.24)
GFR, Estimated: >60 mL/min (ref >60)
Glucose, Bld: 98 mg/dL (ref 70–99)
Potassium: 3.6 mmol/L (ref 3.5–5.1)
Sodium: 137 mmol/L (ref 135–145)

## 2021-08-07 LAB — GLUCOSE, CAPILLARY
Glucose-Capillary: 141 mg/dL — ABNORMAL HIGH (ref 70–99)
Glucose-Capillary: 116 mg/dL — ABNORMAL HIGH (ref 70–99)
Glucose-Capillary: 110 mg/dL — ABNORMAL HIGH (ref 70–99)

## 2021-08-07 LAB — CBC
HCT: 41.3 % (ref 39.0–52.0)
Hemoglobin: 14.1 g/dL (ref 13.0–17.0)
MCH: 30.3 pg (ref 26.0–34.0)
MCHC: 34.1 g/dL (ref 30.0–36.0)
MCV: 88.6 fL (ref 80.0–100.0)
Platelets: 185 10*3/uL (ref 150–400)
RBC: 4.66 MIL/uL (ref 4.22–5.81)
RDW: 13.7 % (ref 11.5–15.5)
WBC: 6.6 10*3/uL (ref 4.0–10.5)
nRBC: 0 % (ref 0.0–0.2)

## 2021-08-07 LAB — HEPARIN LEVEL (UNFRACTIONATED): Heparin Unfractionated: 0.67 IU/mL (ref 0.30–0.70)

## 2021-08-07 SURGERY — RIGHT/LEFT HEART CATH AND CORONARY ANGIOGRAPHY
Anesthesia: LOCAL

## 2021-08-07 MED ORDER — HEPARIN (PORCINE) IN NACL 1000-0.9 UT/500ML-% IV SOLN
INTRAVENOUS | Status: AC
  Filled 2021-08-07: qty 1000

## 2021-08-07 MED ORDER — HYDRALAZINE HCL 20 MG/ML IJ SOLN: 10.0000 mg | INTRAMUSCULAR | Status: AC | PRN

## 2021-08-07 MED ORDER — SODIUM CHLORIDE 0.9 % IV SOLN: INTRAVENOUS | Status: AC

## 2021-08-07 MED ORDER — HEPARIN (PORCINE) IN NACL 1000-0.9 UT/500ML-% IV SOLN
INTRAVENOUS | Status: DC | PRN
  Administered 2021-08-07 (×2): 500 mL

## 2021-08-07 MED ORDER — POTASSIUM CHLORIDE CRYS ER 20 MEQ PO TBCR
40.0000 meq | EXTENDED_RELEASE_TABLET | Freq: Once | ORAL | Status: AC
  Administered 2021-08-07: 40 meq via ORAL
  Filled 2021-08-07: qty 2

## 2021-08-07 MED ORDER — FENTANYL CITRATE (PF) 100 MCG/2ML IJ SOLN
INTRAMUSCULAR | Status: AC
  Filled 2021-08-07: qty 2

## 2021-08-07 MED ORDER — VERAPAMIL HCL 2.5 MG/ML IV SOLN
INTRAVENOUS | Status: AC
  Filled 2021-08-07: qty 2

## 2021-08-07 MED ORDER — ADENOSINE 6 MG/2ML IV SOLN
INTRAVENOUS | Status: AC
  Filled 2021-08-07: qty 2

## 2021-08-07 MED ORDER — LIDOCAINE HCL (PF) 1 % IJ SOLN
INTRAMUSCULAR | Status: AC
  Filled 2021-08-07: qty 30

## 2021-08-07 MED ORDER — VERAPAMIL HCL 2.5 MG/ML IV SOLN
INTRAVENOUS | Status: DC | PRN
  Administered 2021-08-07: 10 mL via INTRA_ARTERIAL

## 2021-08-07 MED ORDER — SODIUM CHLORIDE 0.9% FLUSH: 3.0000 mL | INTRAVENOUS | Status: DC | PRN

## 2021-08-07 MED ORDER — SODIUM CHLORIDE 0.9% FLUSH
3.0000 mL | Freq: Two times a day (BID) | INTRAVENOUS | Status: DC
  Administered 2021-08-08: 3 mL via INTRAVENOUS

## 2021-08-07 MED ORDER — ACETAMINOPHEN 325 MG PO TABS: 650.0000 mg | ORAL_TABLET | ORAL | Status: DC | PRN

## 2021-08-07 MED ORDER — HEPARIN SODIUM (PORCINE) 1000 UNIT/ML IJ SOLN
INTRAMUSCULAR | Status: DC | PRN
  Administered 2021-08-07: 5000 [IU] via INTRAVENOUS

## 2021-08-07 MED ORDER — FENTANYL CITRATE (PF) 100 MCG/2ML IJ SOLN
INTRAMUSCULAR | Status: DC | PRN
  Administered 2021-08-07: 25 ug via INTRAVENOUS

## 2021-08-07 MED ORDER — MIDAZOLAM HCL 2 MG/2ML IJ SOLN
INTRAMUSCULAR | Status: DC | PRN
  Administered 2021-08-07: 1 mg via INTRAVENOUS

## 2021-08-07 MED ORDER — IOHEXOL 350 MG/ML SOLN
INTRAVENOUS | Status: DC | PRN
  Administered 2021-08-07: 45 mL

## 2021-08-07 MED ORDER — APIXABAN 5 MG PO TABS
5.0000 mg | ORAL_TABLET | Freq: Two times a day (BID) | ORAL | Status: DC
  Administered 2021-08-07 – 2021-08-08 (×2): 5 mg via ORAL
  Filled 2021-08-07 (×2): qty 1

## 2021-08-07 MED ORDER — MIDAZOLAM HCL 2 MG/2ML IJ SOLN
INTRAMUSCULAR | Status: AC
  Filled 2021-08-07: qty 2

## 2021-08-07 MED ORDER — HEPARIN SODIUM (PORCINE) 1000 UNIT/ML IJ SOLN
INTRAMUSCULAR | Status: AC
  Filled 2021-08-07: qty 10

## 2021-08-07 MED ORDER — LIDOCAINE HCL (PF) 1 % IJ SOLN
INTRAMUSCULAR | Status: DC | PRN
  Administered 2021-08-07 (×2): 2 mL

## 2021-08-07 MED ORDER — ONDANSETRON HCL 4 MG/2ML IJ SOLN: 4.0000 mg | Freq: Four times a day (QID) | INTRAMUSCULAR | Status: DC | PRN

## 2021-08-07 MED ORDER — LABETALOL HCL 5 MG/ML IV SOLN: 10.0000 mg | INTRAVENOUS | Status: AC | PRN

## 2021-08-07 MED ORDER — SODIUM CHLORIDE 0.9 % IV SOLN: 250.0000 mL | INTRAVENOUS | Status: DC | PRN

## 2021-08-07 SURGICAL SUPPLY — 14 items
CATH BALLN WEDGE 5F 110CM (CATHETERS) ×1 IMPLANT
CATH DIAG 6FR JR4 (CATHETERS) ×1 IMPLANT
CATH INFINITI 6F FL3.5 (CATHETERS) ×1 IMPLANT
GLIDESHEATH SLEND SS 6F .021 (SHEATH) ×1 IMPLANT
GUIDEWIRE .025 260CM (WIRE) ×1 IMPLANT
GUIDEWIRE INQWIRE 1.5J.035X260 (WIRE) IMPLANT
GUIDEWIRE TIGER .035X300 (WIRE) ×1 IMPLANT
INQWIRE 1.5J .035X260CM (WIRE) ×2
KIT HEART LEFT (KITS) ×2 IMPLANT
PACK CARDIAC CATHETERIZATION (CUSTOM PROCEDURE TRAY) ×2 IMPLANT
SHEATH GLIDE SLENDER 4/5FR (SHEATH) ×1 IMPLANT
SYR MEDRAD MARK 7 150ML (SYRINGE) ×2 IMPLANT
TRANSDUCER W/STOPCOCK (MISCELLANEOUS) ×2 IMPLANT
TUBING CIL FLEX 10 FLL-RA (TUBING) ×2 IMPLANT

## 2021-08-07 NOTE — Progress Notes (Signed)
ANTICOAGULATION CONSULT NOTE ? ?Pharmacy Consult for Heparin ?Indication: atrial fibrillation ? ?Allergies  ?Allergen Reactions  ? Aspirin Anaphylaxis  ? Penicillins Anaphylaxis  ? Acetaminophen Other (See Comments)  ?  Unknown  ? 2,4-D Dimethylamine Rash  ? Benzoin Itching and Rash  ? Other Rash  ?  Silk tape  ? ? ?Patient Measurements: ?Height: '5\' 9"'$  (175.3 cm) ?Weight: 72.8 kg (160 lb 6.4 oz) ?IBW/kg (Calculated) : 70.7 ? ?Heparin Dosing Weight: 74 kg ? ?Vital Signs: ?Temp: 97.6 ?F (36.4 ?C) (03/30 0443) ?Temp Source: Oral (03/30 0443) ?BP: 134/66 (03/30 0443) ?Pulse Rate: 50 (03/30 0443) ? ?Labs: ?Recent Labs  ?  08/05/21 ?1707 08/05/21 ?2144 08/06/21 ?3710 08/06/21 ?1829 08/07/21 ?0258  ?HGB 15.0  --  13.2  --  14.1  ?HCT 46.1  --  39.6  --  41.3  ?PLT 194  --  169  --  185  ?HEPARINUNFRC  --   --   --  0.47 0.67  ?CREATININE 1.26*  --  1.00  --   --   ?TROPONINIHS 15 15  --   --   --   ? ? ? ?Estimated Creatinine Clearance: 53 mL/min (by C-G formula based on SCr of 1 mg/dL). ? ? ?Assessment: ?58 yoM admitted with syncope and AFib RVR. Pharmacy asked to start IV heparin. Pt was not on AC PTA. CBC wnl, platelets 169.  ? ?Heparin level therapeutic (0.67) on infusion at 1100 units/hr. No bleeding noted. ? ?Goal of Therapy:  ?Heparin level 0.3-0.7 units/ml ?Monitor platelets by anticoagulation protocol: Yes ?  ?Plan:  ?Continue heparin infusion at 1100 units/hr ?F/u daily heparin level and CBC ? ?Sherlon Handing, PharmD, BCPS ?Please see amion for complete clinical pharmacist phone list ?08/07/2021 4:54 AM ? ?

## 2021-08-07 NOTE — Progress Notes (Signed)
? ?Progress Note ? ?Patient Name: Vincent Howell [facility] ?Date of Encounter: 08/07/2021 ? ?[facility] Cardiologist: Kirk Ruths, MD  ? ?Subjective  ? ?BP 146/76 this morning.  Creatinine 0.8  Denies any chest pain or dyspnea or lightheadedness ? ?Inpatient Medications  ?  ?Scheduled Meds: ? amiodarone  200 mg Oral BID  ? atorvastatin  40 mg Oral Daily  ? insulin aspart  0-5 Units Subcutaneous QHS  ? insulin aspart  0-6 Units Subcutaneous TID WC  ? pyridOXINE  100 mg Oral Daily  ? sodium chloride flush  3 mL Intravenous Q12H  ? ?Continuous Infusions: ? sodium chloride    ? sodium chloride 10 mL/hr at 08/07/21 0748  ? heparin 1,100 Units/hr (08/06/21 1408)  ? ?PRN Meds: ?sodium chloride, acetaminophen, nitroGLYCERIN, ondansetron (ZOFRAN) IV, sodium chloride flush  ? ?Vital Signs  ?  ?Vitals:  ? 08/06/21 2009 08/07/21 0015 08/07/21 0443 08/07/21 0800  ?BP: 122/83 130/86 134/66 (!) 146/76  ?Pulse: 66 (!) 57 (!) 50 (!) 57  ?Resp: '18 16 16 17  '$ ?Temp: 97.9 ?F (36.6 ?C) 97.9 ?F (36.6 ?C) 97.6 ?F (36.4 ?C) 98.1 ?F (36.7 ?C)  ?TempSrc: Oral Oral Oral Oral  ?SpO2: 97% 96% 99% 95%  ?Weight:   72.8 kg   ?Height:      ? ? ?Intake/Output Summary (Last 24 hours) at 08/07/2021 1111 ?Last data filed at 08/06/2021 1500 ?Gross per 24 hour  ?Intake 486.5 ml  ?Output --  ?Net 486.5 ml  ? ? ? ?  08/07/2021  ?  4:43 AM 08/06/2021  ? 10:00 AM 08/05/2021  ?  3:32 PM  ?Last 3 Weights  ?Weight (lbs) 160 lb 6.4 oz 160 lb 8 oz 163 lb  ?Weight (kg) 72.757 kg 72.802 kg 73.936 kg  ?   ? ?Telemetry  ?  ?Sinus rhythm with PACs, rate 50s to 60s- Personally Reviewed ? ?ECG  ?  ?No new ECG- Personally Reviewed ? ?Physical Exam  ? ?GEN: No acute distress.   ?Neck: No JVD ?Cardiac: RRR, no murmurs, rubs, or gallops.  ?Respiratory: Clear to auscultation bilaterally. ?GI: Soft, nontender, non-distended  ?MS: No edema; No deformity. ?Neuro:  Nonfocal  ?Psych: Normal affect  ? ?Labs  ?  ?High Sensitivity Troponin:   ?Recent Labs  ?Lab 08/05/21 ?1707 08/05/21 ?2144   ?TROPONINIHS 15 15  ? ?   ?Chemistry ?Recent Labs  ?Lab 08/05/21 ?1707 08/06/21 ?4163 08/07/21 ?0258  ?NA 138 139 137  ?K 4.5 4.4 3.6  ?CL 105 104 103  ?CO2 '23 26 24  '$ ?GLUCOSE 139* 115* 98  ?BUN 25* 24* 16  ?CREATININE 1.26* 1.00 0.76  ?CALCIUM 9.5 9.2 9.2  ?MG 1.9  --   --   ?PROT 7.6  --   --   ?ALBUMIN 4.1  --   --   ?AST 24  --   --   ?ALT 19  --   --   ?ALKPHOS 74  --   --   ?BILITOT 1.1  --   --   ?GFRNONAA 56* >60 >60  ?ANIONGAP '10 9 10  '$ ? ?  ?Lipids  ?Recent Labs  ?Lab 08/06/21 ?8453  ?CHOL 107  ?TRIG 55  ?HDL 46  ?[place] 50  ?CHOLHDL 2.3  ? ?  ?Hematology ?Recent Labs  ?Lab 08/05/21 ?1707 08/06/21 ?6468 08/07/21 ?0258  ?WBC 5.5 7.0 6.6  ?RBC 4.99 4.32 4.66  ?HGB 15.0 13.2 14.1  ?HCT 46.1 39.6 41.3  ?MCV 92.4 91.7 88.6  ?MCH 30.1  30.6 30.3  ?MCHC 32.5 33.3 34.1  ?RDW 13.9 14.0 13.7  ?PLT 194 169 185  ? ? ?Thyroid  ?Recent Labs  ?Lab 08/05/21 ?1707  ?TSH 3.770  ?FREET4 0.76  ? ?  ?BNPNo results for input(s): BNP, PROBNP in the last 168 hours.  ?DDimer No results for input(s): DDIMER in the last 168 hours.  ? ?Radiology  ?  ?Portable chest x-ray 1 view ? ?Result Date: 08/05/2021 ?CLINICAL DATA:  Intermittent episodes of lightheadedness x1 month. Syncopal event today. EXAM: PORTABLE CHEST 1 VIEW COMPARISON:  December 09, 2007 FINDINGS: The heart size and mediastinal contours are within normal limits. Right costophrenic angle is obscured by collimation. No focal airspace consolidation. No visible pleural effusion or pneumothorax. The visualized skeletal structures are unremarkable. IMPRESSION: No acute cardiopulmonary disease. Electronically Signed   By: Dahlia Bailiff M.D.   On: 08/05/2021 17:56  ? ?ECHOCARDIOGRAM COMPLETE ? ?Result Date: 08/06/2021 ?   ECHOCARDIOGRAM REPORT   Patient Name:   Vincent Howell Date of Exam: 08/06/2021 Medical Rec #:  [id number]       Height:       69.5 in Accession #:    [id number]      Weight:       163.0 lb Date of Birth:  03-07-1933        BSA:          1.903 m? Patient Age:    88  years        BP:           144/67 mmHg Patient Gender: M               HR:           59 bpm. Exam Location:  Inpatient Procedure: 2D Echo Indications:    syncope  History:        Patient has no prior history of Echocardiogram examinations.                 Abnormal ECG; Arrythmias:Atrial Fibrillation and PVC.  Sonographer:    Johny Chess RDCS Referring Phys: [place]  1. Left ventricular ejection fraction, by estimation, is 35 to 40%. The left ventricle has moderately decreased function. The left ventricle demonstrates regional wall motion abnormalities (see scoring diagram/findings for description). Left ventricular  diastolic parameters are consistent with Grade II diastolic dysfunction (pseudonormalization).  2. Right ventricular systolic function is normal. The right ventricular size is normal. Tricuspid regurgitation signal is inadequate for assessing PA pressure.  3. Left atrial size was moderately dilated.  4. The mitral valve is degenerative. Mild mitral valve regurgitation. No evidence of mitral stenosis.  5. The aortic valve is grossly normal. Aortic valve regurgitation is trivial. No aortic stenosis is present.  6. The inferior vena cava is normal in size with greater than 50% respiratory variability, suggesting right atrial pressure of 3 mmHg. FINDINGS  Left Ventricle: Left ventricular ejection fraction, by estimation, is 35 to 40%. The left ventricle has moderately decreased function. The left ventricle demonstrates regional wall motion abnormalities. The left ventricular internal cavity size was normal in size. There is no left ventricular hypertrophy. Left ventricular diastolic parameters are consistent with Grade II diastolic dysfunction (pseudonormalization).  LV Wall Scoring: The inferior wall and posterior wall are hypokinetic. Right Ventricle: The right ventricular size is normal. No increase in right ventricular wall thickness. Right ventricular systolic function is  normal. Tricuspid regurgitation signal is inadequate for assessing PA pressure. Left Atrium:  Left atrial size was moderately dilated. Right Atrium: Right atrial size was normal in size. Pericardium: There is no evidence of pericardial effusion. Mitral Valve: The mitral valve is degenerative in appearance. Mild mitral annular calcification. Mild mitral valve regurgitation. No evidence of mitral valve stenosis. Tricuspid Valve: The tricuspid valve is normal in structure. Tricuspid valve regurgitation is trivial. No evidence of tricuspid stenosis. Aortic Valve: The aortic valve is grossly normal. Aortic valve regurgitation is trivial. No aortic stenosis is present. Pulmonic Valve: The pulmonic valve was normal in structure. Pulmonic valve regurgitation is mild. No evidence of pulmonic stenosis. Aorta: The aortic root is normal in size and structure. Venous: The inferior vena cava is normal in size with greater than 50% respiratory variability, suggesting right atrial pressure of 3 mmHg. IAS/Shunts: The interatrial septum appears to be lipomatous. No atrial level shunt detected by color flow Doppler.  LEFT VENTRICLE PLAX 2D LVIDd:         5.50 cm   Diastology LVIDs:         4.50 cm   LV e' medial:    4.79 cm/s LV PW:         0.90 cm   LV E/e' medial:  21.3 LV IVS:        0.90 cm   LV e' lateral:   6.96 cm/s LVOT diam:     1.80 cm   LV E/e' lateral: 14.7 LV SV:         55 LV SV Index:   29 LVOT Area:     2.54 cm?  RIGHT VENTRICLE             IVC RV S prime:     10.90 cm/s  IVC diam: 1.20 cm LEFT ATRIUM             Index        RIGHT ATRIUM           Index LA diam:        3.80 cm 2.00 cm/m?   RA Area:     13.10 cm? LA Vol (A2C):   90.7 ml 47.65 ml/m?  RA Volume:   30.10 ml  15.81 ml/m? LA Vol (A4C):   74.9 ml 39.35 ml/m? LA Biplane Vol: 86.3 ml 45.34 ml/m?  AORTIC VALVE LVOT Vmax:   90.30 cm/s LVOT Vmean:  64.000 cm/s LVOT VTI:    0.215 m  AORTA Ao Root diam: 2.80 cm Ao Asc diam:  3.30 cm MITRAL VALVE MV Area (PHT): 3.06  cm?     SHUNTS MV Decel Time: 248 msec     Systemic VTI:  0.22 m MV E velocity: 102.00 cm/s  Systemic Diam: 1.80 cm MV A velocity: 56.30 cm/s MV E/A ratio:  1.81 Cherlynn Kaiser MD Electronically signed b

## 2021-08-07 NOTE — Progress Notes (Signed)
?  Transition of Care (TOC) Screening Note ? ? ?Patient Details  ?Name: Vincent Howell ?Date of Birth: [birth year] ? ? ?Transition of Care (TOC) CM/SW Contact:    ?Milas Gain, LCSWA ?Phone Number: ?08/07/2021, 5:02 PM ? ? ? ?Transition of Care Department [name]) has reviewed patient and no TOC needs have been identified at this time. We will continue to monitor patient advancement through interdisciplinary progression rounds. If new patient transition needs arise, please place a TOC consult. ?  ?

## 2021-08-07 NOTE — Interval H&P Note (Signed)
History and Physical Interval Note: ? ?08/07/2021 ?10:38 AM ? ?Vincent Howell  has presented today for surgery, with the diagnosis of heart failure.  The various methods of treatment have been discussed with the patient and family. After consideration of risks, benefits and other options for treatment, the patient has consented to  Procedure(s): ?RIGHT/LEFT HEART CATH AND CORONARY ANGIOGRAPHY (N/A) as a surgical intervention.  The patient's history has been reviewed, patient examined, no change in status, stable for surgery.  I have reviewed the patient's chart and labs.  Questions were answered to the patient's satisfaction.   ? ?Cath Lab Visit (complete for each Cath Lab visit) ? ?Clinical Evaluation Leading to the Procedure:  ? ?ACS: No. ? ?Non-ACS:   ? ?Anginal Classification: No Symptoms ? ?Anti-ischemic medical therapy: Minimal Therapy (1 class of medications) ? ?Non-Invasive Test Results: No non-invasive testing performed ? ?Prior CABG: No previous CABG ? ? ? ? ? ? ? ?Vincent Howell ? ? ?

## 2021-08-07 NOTE — TOC Benefit Eligibility Note (Signed)
Patient Advocate Encounter ? ?Insurance verification completed.   ? ?The patient is currently admitted and upon discharge could be taking Eliquis 5 mg. ? ?The current 30 day co-pay is, $573.42 due to a $496.33 deductible remaining.  ? ?The patient is insured through [employer] Medicare Part D  ? ? ? ?Lyndel Safe, CPhT ?Pharmacy Patient Advocate Specialist ?[place] Patient Advocate Team ?Direct Number: [phone]  Fax: [phone] ? ? ? ? ? ?  ?

## 2021-08-08 ENCOUNTER — Encounter (HOSPITAL_COMMUNITY): Payer: Self-pay | Admitting: Cardiology

## 2021-08-08 ENCOUNTER — Inpatient Hospital Stay (HOSPITAL_BASED_OUTPATIENT_CLINIC_OR_DEPARTMENT_OTHER)
Admit: 2021-08-08 | Discharge: 2021-08-08 | Disposition: A | Discharge status: Home / Self Care | Payer: Medicare Other | Attending: Physician Assistant | Admitting: Physician Assistant

## 2021-08-08 ENCOUNTER — Other Ambulatory Visit (HOSPITAL_COMMUNITY): Payer: Self-pay

## 2021-08-08 ENCOUNTER — Telehealth: Payer: Self-pay | Admitting: Physician Assistant

## 2021-08-08 DIAGNOSIS — R55 Syncope and collapse: Secondary | ICD-10-CM

## 2021-08-08 DIAGNOSIS — E785 Hyperlipidemia, unspecified: Secondary | ICD-10-CM

## 2021-08-08 DIAGNOSIS — Z7901 Long term (current) use of anticoagulants: Secondary | ICD-10-CM

## 2021-08-08 DIAGNOSIS — E1169 Type 2 diabetes mellitus with other specified complication: Secondary | ICD-10-CM

## 2021-08-08 LAB — CBC
HCT: 39.8 % (ref 39.0–52.0)
Hemoglobin: 13.5 g/dL (ref 13.0–17.0)
MCH: 30.3 pg (ref 26.0–34.0)
MCHC: 33.9 g/dL (ref 30.0–36.0)
MCV: 89.2 fL (ref 80.0–100.0)
Platelets: 183 10*3/uL (ref 150–400)
RBC: 4.46 MIL/uL (ref 4.22–5.81)
RDW: 13.7 % (ref 11.5–15.5)
WBC: 6.3 10*3/uL (ref 4.0–10.5)
nRBC: 0 % (ref 0.0–0.2)

## 2021-08-08 LAB — MAGNESIUM: Magnesium: 1.8 mg/dL (ref 1.7–2.4)

## 2021-08-08 LAB — GLUCOSE, CAPILLARY
Glucose-Capillary: 121 mg/dL — ABNORMAL HIGH (ref 70–99)
Glucose-Capillary: 139 mg/dL — ABNORMAL HIGH (ref 70–99)

## 2021-08-08 LAB — BASIC METABOLIC PANEL
Anion gap: 9 (ref 5–15)
BUN: 17 mg/dL (ref 8–23)
CO2: 24 mmol/L (ref 22–32)
Calcium: 9 mg/dL (ref 8.9–10.3)
Chloride: 104 mmol/L (ref 98–111)
Creatinine, Ser: 0.95 mg/dL (ref 0.61–1.24)
GFR, Estimated: >60 mL/min (ref >60)
Glucose, Bld: 97 mg/dL (ref 70–99)
Potassium: 4.3 mmol/L (ref 3.5–5.1)
Sodium: 137 mmol/L (ref 135–145)

## 2021-08-08 MED ORDER — AMIODARONE HCL 200 MG PO TABS
ORAL_TABLET | ORAL | 0 refills | Status: DC
  Filled 2021-08-08: qty 44, 30d supply, fill #0

## 2021-08-08 MED ORDER — NITROGLYCERIN 0.4 MG SL SUBL
0.4000 mg | SUBLINGUAL_TABLET | SUBLINGUAL | 3 refills | Status: AC | PRN
Start: 2021-08-08 — End: ?
  Filled 2021-08-08: qty 25, 14d supply, fill #0

## 2021-08-08 MED ORDER — APIXABAN 5 MG PO TABS
5.0000 mg | ORAL_TABLET | Freq: Two times a day (BID) | ORAL | 3 refills | Status: DC
  Filled 2021-08-08: qty 60, 30d supply, fill #0

## 2021-08-08 MED ORDER — LOSARTAN POTASSIUM 25 MG PO TABS
25.0000 mg | ORAL_TABLET | Freq: Every day | ORAL | Status: DC
  Administered 2021-08-08: 25 mg via ORAL
  Filled 2021-08-08: qty 1

## 2021-08-08 MED ORDER — LOSARTAN POTASSIUM 25 MG PO TABS
25.0000 mg | ORAL_TABLET | Freq: Every day | ORAL | 3 refills | Status: DC
Start: 2021-08-09 — End: 2022-01-01
  Filled 2021-08-08: qty 90, 90d supply, fill #0

## 2021-08-08 MED ORDER — AMIODARONE HCL 200 MG PO TABS: 200.0000 mg | ORAL_TABLET | Freq: Every day | ORAL | 3 refills | Status: DC

## 2021-08-08 MED ORDER — METFORMIN HCL ER 500 MG PO TB24: 1500.0000 mg | ORAL_TABLET | Freq: Every day | ORAL | Status: DC

## 2021-08-08 MED FILL — Adenosine IV Soln 6 MG/2ML: INTRAVENOUS | Qty: 2 | Status: AC

## 2021-08-08 NOTE — Care Management (Signed)
08-08-21 Case Manager spoke with patient regarding PCP. Patient is aware to contact insurance to see which providers are in network and he will arrange PCP outpatient. No further needs from Case Manager at this time.  ?

## 2021-08-08 NOTE — Progress Notes (Addendum)
Patient ambulated the length of the hall, ambulating independently with steady gait and no s/s intolerance. Denies chest pain or shortness of breath. VSS.   ?Needs requested for help in finding a PCP. Notified Jacqlyn Krauss CM and she reviewed information with patient and wife. ?

## 2021-08-08 NOTE — Discharge Summary (Signed)
?Discharge Summary  ?  ?Patient ID: Vincent Howell ?MRN: [id number]; DOB: 01/05/1933 ? ?Admit date: 08/05/2021 ?Discharge date: 08/08/2021 ? ?PCP:  Pcp, No ?  ?[facility] Providers ?Cardiologist:  Kirk Ruths, MD  ? ?Discharge Diagnoses  ?  ?Principal Problem: ?  Syncope ?Active Problems: ?  Atrial fibrillation with rapid ventricular response ([place]) ?  Coronary artery disease involving native coronary artery of native heart without angina pectoris ?  Syncope and collapse ?  Acute combined systolic and diastolic heart failure ([place]) ?  Chronic anticoagulation ?  Hyperlipidemia LDL goal <70 ? ? ?Diagnostic Studies/Procedures  ?  ?Echo 08/06/21: ?1. Left ventricular ejection fraction, by estimation, is 35 to 40%. The  ?left ventricle has moderately decreased function. The left ventricle  ?demonstrates regional wall motion abnormalities (see scoring  ?diagram/findings for description). Left ventricular  ? diastolic parameters are consistent with Grade II diastolic dysfunction  ?(pseudonormalization).  ? 2. Right ventricular systolic function is normal. The right ventricular  ?size is normal. Tricuspid regurgitation signal is inadequate for assessing  ?PA pressure.  ? 3. Left atrial size was moderately dilated.  ? 4. The mitral valve is degenerative. Mild mitral valve regurgitation. No  ?evidence of mitral stenosis.  ? 5. The aortic valve is grossly normal. Aortic valve regurgitation is  ?trivial. No aortic stenosis is present.  ? 6. The inferior vena cava is normal in size with greater than 50%  ?respiratory variability, suggesting right atrial pressure of 3 mmHg.  ?_____________ ? ?Left and right heart catheterization 08/07/21: ?  Mid LAD lesion is 90% stenosed. ?  2nd Mrg lesion is 25% stenosed. ?  Prox LAD lesion is 60% stenosed. ?  Prox RCA lesion is 30% stenosed. ?  3rd RPL lesion is 80% stenosed. ?  LV end diastolic pressure is low. ?  ?1.  High-grade mid LAD and RPLV stenoses with moderate proximal LAD stenosis  as detailed above.  The results were discussed with Dr. Gardiner Rhyme.  Given the fact the patient is highly functional without exertional angina and did not develop an acute coronary syndrome with his presentation of syncope leading to his hospitalization (negative HS-troponins), PCI will be deferred and optimal medical therapy pursued. ?2.  Normal cardiac output and index with relatively low filling pressures with mean RA of 5 mmHg, mean wedge of 7 mmHg, and LVEDP of 4 mmHg. ?  ?  ?History of Present Illness   ?  ?Vincent Howell is a 88 y.o. male with CAD and HLD is followed by Dr. Stanford Breed.  ? ?He was evaluated for syncope and FU coronary artery disease.  Patient apparently had angioplasty of unknown vessel at [facility] in the 1980s.  Patient seen in the office January 2023 with question atrial fibrillation.  His electrocardiograms were reviewed and revealed sinus rhythm with PACs.  Patient seen in the emergency room February 2023 with near syncopal episodes.  Patient states that these episodes occurred while sitting.  He would feel lightheaded with no associated symptoms.  Symptoms would last 15 minutes to 2 hours.  He did not check his blood pressure during that time.  ER labs show BUN 16 and creatinine 0.78.  Hemoglobin 13.5.  Electrocardiogram showed sinus rhythm with PAC and PVC.  Note blood pressure at that time was 147/76.  Heart rate 54.  Patient contacted the office today with syncope and added to Dr. Jacalyn Lefevre schedule on 08/05/21.  Since last seen patient had a syncopal episode this morning at 8 AM.  He states he was in the shower and became dizzy followed by frank syncope for several seconds.  He found himself on the floor.  No preceding chest pain, palpitations, nausea, dyspnea.  No incontinence or seizure activity.  No injury. Dr. Stanford Breed recommended direct admission to [facility]. Due to no beds available, he was sent to the ER.  ?  ?On Arrival to ER he was found to be in A fib with RVR.  BP 338 systolic.   No lightheadedness or dizziness currently.  Unaware of rapid HR and no chest pain or SOB.  No history of bleeding.  BP is 250 systolic now.  ?  ? ?Hospital Course  ?   ?Consultants:  ? ?Acute combined systolic and diastolic heart failure ?CAD ?LVEF 35 to 40% with inferior/inferolateral hypokinesis.  He proceeded to [facility] that showed high-grade mid LAD disease and R PLV stenosis with moderate proximal LAD stenosis.  Given his high functional capacity (he walks on the treadmill 40 minutes daily) and lack of symptoms, interventional cardiology recommended medical management.  GDMT titrated to include losartan.  He has been normotensive and this is limiting therapy.  No beta-blocker given baseline low resting heart rate. ? ?And outpatient follow-up, may consider adding spironolactone and SGLT2 inhibitor. ? ? ?Syncope ?Unclear etiology.  Certainly hypotension and A-fib RVR could be contributory.  Short runs of NSVT on telemetry during admission.  We placed a 14-day live Zio patch at discharge. ? ? ?Atrial fibrillation with RVR ?Was in sinus rhythm at his office visit just prior to admission.  He converted to sinus rhythm on IV amiodarone.  We will continue p.o. amiodarone taper with 200 mg twice daily x14 days, then reduce to 200 mg daily.  No beta-blocker given low resting heart rate. ? ? ?Need for anticoagulation ?CHA2DS2-VASc 5 for hypertension, age 13, DM, CAD. ?He was started on 5 mg Eliquis twice daily. ? ? ?Hypertension ?Home amlodipine was discontinued in favor of losartan for newly recognized heart failure. ? ? ?Hyperlipidemia ?Continue 40 mg atorvastatin. ?08/06/2021: Cholesterol 107; HDL 46; LDL Cholesterol 50; Triglycerides 55; VLDL 11 ? ? ?Pt was instructed not to drive until seen in follow up. He is aware he may have a 6 month driving restriction depending on heart monitor. Will defer to primary cardiologist for further discussion. ? ?Pt seen and examined by Dr. Gardiner Rhyme and deemed stable for discharge  following placement of zio patch.  ? ?BMP in 1 week.  ?Follow up with Dr. Stanford Breed in 4-5 weeks. ? ? ?Did the patient have an acute coronary syndrome (MI, NSTEMI, STEMI, etc) this admission?:  No                               ?Did the patient have a percutaneous coronary intervention (stent / angioplasty)?:  No.   ?  ? ?  ?_____________ ? ?Discharge Vitals ?Blood pressure 133/71, pulse (!) 53, temperature 97.7 ?F (36.5 ?C), temperature source Oral, resp. rate 15, height '5\' 9"'$  (1.753 m), weight 72.8 kg, SpO2 95 %.  ?Filed Weights  ? 08/06/21 1000 08/07/21 0443 08/08/21 0445  ?Weight: 72.8 kg 72.8 kg 72.8 kg  ? ? ?Labs & Radiologic Studies  ?  ?CBC ?Recent Labs  ?  08/05/21 ?1707 08/06/21 ?5397 08/07/21 ?0258 08/08/21 ?0225  ?WBC 5.5   < > 6.6 6.3  ?NEUTROABS 3.5  --   --   --   ?HGB 15.0   < >  14.1 13.5  ?HCT 46.1   < > 41.3 39.8  ?MCV 92.4   < > 88.6 89.2  ?PLT 194   < > 185 183  ? < > = values in this interval not displayed.  ? ?Basic Metabolic Panel ?Recent Labs  ?  08/05/21 ?1707 08/06/21 ?9417 08/07/21 ?0258 08/08/21 ?0225  ?NA 138   < > 137 137  ?K 4.5   < > 3.6 4.3  ?CL 105   < > 103 104  ?CO2 23   < > 24 24  ?GLUCOSE 139*   < > 98 97  ?BUN 25*   < > 16 17  ?CREATININE 1.26*   < > 0.76 0.95  ?CALCIUM 9.5   < > 9.2 9.0  ?MG 1.9  --   --  1.8  ? < > = values in this interval not displayed.  ? ?Liver Function Tests ?Recent Labs  ?  08/05/21 ?1707  ?AST 24  ?ALT 19  ?ALKPHOS 74  ?BILITOT 1.1  ?PROT 7.6  ?ALBUMIN 4.1  ? ?No results for input(s): LIPASE, AMYLASE in the last 72 hours. ?High Sensitivity Troponin:   ?Recent Labs  ?Lab 08/05/21 ?1707 08/05/21 ?2144  ?TROPONINIHS 15 15  ?  ?BNP ?Invalid input(s): POCBNP ?D-Dimer ?No results for input(s): DDIMER in the last 72 hours. ?Hemoglobin A1C ?Recent Labs  ?  08/05/21 ?1709  ?HGBA1C 6.2*  ? ?Fasting Lipid Panel ?Recent Labs  ?  08/06/21 ?4081  ?CHOL 107  ?HDL 46  ?[place] 50  ?TRIG 55  ?CHOLHDL 2.3  ? ?Thyroid Function Tests ?Recent Labs  ?  08/05/21 ?1707  ?TSH  3.770  ? ?_____________  ?CARDIAC CATHETERIZATION ? ?Result Date: 08/07/2021 ?  Mid LAD lesion is 90% stenosed.   2nd Mrg lesion is 25% stenosed.   Prox LAD lesion is 60% stenosed.   Prox RCA lesion is 30% s

## 2021-08-08 NOTE — Progress Notes (Signed)
? ?Progress Note ? ?Patient Name: Vincent Howell [facility] ?Date of Encounter: 08/08/2021 ? ?[facility] Cardiologist: Kirk Ruths, MD  ? ?Subjective  ? ?BP 133/71 this morning.  Creatinine 0.85  Denies any chest pain or dyspnea or lightheadedness ? ?Inpatient Medications  ?  ?Scheduled Meds: ? amiodarone  200 mg Oral BID  ? apixaban  5 mg Oral BID  ? atorvastatin  40 mg Oral Daily  ? insulin aspart  0-5 Units Subcutaneous QHS  ? insulin aspart  0-6 Units Subcutaneous TID WC  ? losartan  25 mg Oral Daily  ? pyridOXINE  100 mg Oral Daily  ? sodium chloride flush  3 mL Intravenous Q12H  ? ?Continuous Infusions: ? sodium chloride    ? ?PRN Meds: ?sodium chloride, acetaminophen, nitroGLYCERIN, ondansetron (ZOFRAN) IV, sodium chloride flush  ? ?Vital Signs  ?  ?Vitals:  ? 08/07/21 1537 08/07/21 1707 08/07/21 2007 08/08/21 0445  ?BP: 125/64 116/69 122/71 133/71  ?Pulse: (!) 55 (!) 46 63 (!) 53  ?Resp: '18  16 15  '$ ?Temp:   97.7 ?F (36.5 ?C) 97.7 ?F (36.5 ?C)  ?TempSrc:   Oral Oral  ?SpO2: 95% 93% 94% 95%  ?Weight:    72.8 kg  ?Height:      ? ? ?Intake/Output Summary (Last 24 hours) at 08/08/2021 1016 ?Last data filed at 08/07/2021 1839 ?Gross per 24 hour  ?Intake 901.4 ml  ?Output --  ?Net 901.4 ml  ? ? ? ?  08/08/2021  ?  4:45 AM 08/07/2021  ?  4:43 AM 08/06/2021  ? 10:00 AM  ?Last 3 Weights  ?Weight (lbs) 160 lb 8 oz 160 lb 6.4 oz 160 lb 8 oz  ?Weight (kg) 72.802 kg 72.757 kg 72.802 kg  ?   ? ?Telemetry  ?  ?Sinus rhythm with PACs, rate 50s to 60s- Personally Reviewed ? ?ECG  ?  ?No new ECG- Personally Reviewed ? ?Physical Exam  ? ?GEN: No acute distress.   ?Neck: No JVD ?Cardiac: RRR, no murmurs, rubs, or gallops.  ?Respiratory: Clear to auscultation bilaterally. ?GI: Soft, nontender, non-distended  ?MS: No edema; No deformity. ?Neuro:  Nonfocal  ?Psych: Normal affect  ? ?Labs  ?  ?High Sensitivity Troponin:   ?Recent Labs  ?Lab 08/05/21 ?1707 08/05/21 ?2144  ?TROPONINIHS 15 15  ? ?   ?Chemistry ?Recent Labs  ?Lab 08/05/21 ?1707  08/06/21 ?5638 08/07/21 ?0258 08/08/21 ?0225  ?NA 138 139 137 137  ?K 4.5 4.4 3.6 4.3  ?CL 105 104 103 104  ?CO2 '23 26 24 24  '$ ?GLUCOSE 139* 115* 98 97  ?BUN 25* 24* 16 17  ?CREATININE 1.26* 1.00 0.76 0.95  ?CALCIUM 9.5 9.2 9.2 9.0  ?MG 1.9  --   --  1.8  ?PROT 7.6  --   --   --   ?ALBUMIN 4.1  --   --   --   ?AST 24  --   --   --   ?ALT 19  --   --   --   ?ALKPHOS 74  --   --   --   ?BILITOT 1.1  --   --   --   ?GFRNONAA 56* >60 >60 >60  ?ANIONGAP '10 9 10 9  '$ ? ?  ?Lipids  ?Recent Labs  ?Lab 08/06/21 ?9373  ?CHOL 107  ?TRIG 55  ?HDL 46  ?[place] 50  ?CHOLHDL 2.3  ? ?  ?Hematology ?Recent Labs  ?Lab 08/06/21 ?4287 08/07/21 ?0258 08/08/21 ?0225  ?WBC  7.0 6.6 6.3  ?RBC 4.32 4.66 4.46  ?HGB 13.2 14.1 13.5  ?HCT 39.6 41.3 39.8  ?MCV 91.7 88.6 89.2  ?MCH 30.6 30.3 30.3  ?MCHC 33.3 34.1 33.9  ?RDW 14.0 13.7 13.7  ?PLT 169 185 183  ? ? ?Thyroid  ?Recent Labs  ?Lab 08/05/21 ?1707  ?TSH 3.770  ?FREET4 0.76  ? ?  ?BNPNo results for input(s): BNP, PROBNP in the last 168 hours.  ?DDimer No results for input(s): DDIMER in the last 168 hours.  ? ?Radiology  ?  ?CARDIAC CATHETERIZATION ? ?Result Date: 08/07/2021 ?  Mid LAD lesion is 90% stenosed.   2nd Mrg lesion is 25% stenosed.   Prox LAD lesion is 60% stenosed.   Prox RCA lesion is 30% stenosed.   3rd RPL lesion is 80% stenosed.   LV end diastolic pressure is low. 1.  High-grade mid LAD and RPLV stenoses with moderate proximal LAD stenosis as detailed above.  The results were discussed with Dr. Gardiner Rhyme.  Given the fact the patient is highly functional without exertional angina and did not develop an acute coronary syndrome with his presentation of syncope leading to his hospitalization (negative HS-troponins), PCI will be deferred and optimal medical therapy pursued. 2.  Normal cardiac output and index with relatively low filling pressures with mean RA of 5 mmHg, mean wedge of 7 mmHg, and LVEDP of 4 mmHg.   ? ?Cardiac Studies  ? ? ? ?Patient Profile  ?   ?88 y.o. male with CAD  (status post angioplasty in 1980s), hypertension, hyperlipidemia, T2DM, prostate cancer who was admitted 08/05/21 with syncope. ? ?Assessment & Plan  ?  ?Acute combined systolic and diastolic heart failure: EF 35 to 40% inferior/inferolateral hypokinesis.  LHC/RHC showed high-grade mid LAD and RPLV stenosis with moderate proximal LAD stenosis.  Interventional cardiology recommending medical management given high functional capacity able with no exertional symptoms.  Low filling pressures. ?-Has been normotensive, will add losartan 25 mg daily ?-Hold off on beta-blocker given low resting heart rate ?-Can follow-up as outpatient to consider adding spironolactone and SGLT2 inhibitor ? ?Syncope: Unclear cause.  Had low blood pressure which may have been contributing.  Could have been related to A-fib with RVR.  Echocardiogram shows new systolic heart failure as above.  Has had normal to mildly elevated BP throughout admission. ?-Did have short runs of NSVT yesterday.  Recommend Zio patch x2 weeks on discharge ? ?A-fib with RVR: Found to be in A-fib and RVR on presentation to ED.  At office visit the same day was in sinus rhythm.  Started on IV amiodarone drip and converted to sinus rhythm.  CHA2DS2-VASc score 5 (hypertension, age x2, T2DM, CAD) ?-Continue Eliquis 5 mg twice daily ?-Switched to p.o. amiodarone 200 mg twice daily.  Would continue x2 weeks then decrease to 200 mg daily ?-No beta-blocker given low resting heart rate ? ?CAD: Reported angioplasty in 1980s.  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  LHC/RHC showed high-grade mid LAD and RPLV stenosis with moderate proximal LAD stenosis. ?-Continue Eliquis, statin ? ?Hypertension: Hold Home amlodipine and will start losartan for heart failure as above. ? ?Hyperlipidemia: LDL 50.  On atorvastatin 40 mg daily ? ?T2DM: SSI ? ?Disposition: OKfor discharge today ?Medication Recommendations: Eliquis 5 mg twice daily, amiodarone  200 mg twice daily x2 weeks then decrease to 200 mg daily, atorvastatin 40 mg daily, losartan 25 mg daily ?Other recommendations (labs, testing, etc):  Live zio x 2  weeks on discharge.  BMET in 1 week ?Follow up as an outpatient:  Will schedule ? ? ? ? ?For questions or updates, please contact [place] ?Please consult [email or web address] for contact info under  ? ?  ?   ?Signed, ?Donato Heinz, MD  ?08/08/2021, 10:16 AM   ? ?

## 2021-08-08 NOTE — Telephone Encounter (Signed)
Page received from [employer]: ? ?First occurrence of 5:38 pm eastern device triggered Afib with rate 115-164 lasting for 90 sec.  ?

## 2021-08-08 NOTE — Discharge Instructions (Signed)

## 2021-08-08 NOTE — Progress Notes (Signed)
Zio patch placed onto patient.  All instructions and information reviewed with patient, they verbalize understanding with no questions. 

## 2021-08-12 ENCOUNTER — Other Ambulatory Visit (HOSPITAL_COMMUNITY): Payer: Self-pay

## 2021-08-12 ENCOUNTER — Telehealth: Payer: Self-pay | Admitting: Cardiology

## 2021-08-12 NOTE — Telephone Encounter (Signed)
Patient reports numbness/burning in right thigh mid to outer side that began on Sunday and is constant. He denies any injuries. BP 164/79, P 50. Taking amiodarone 200 mg daily, losartan 25 mg daily. Recently d/c from hospital. Had heart cath per rt wrist. No bleeding or edema at the site. Recommended that he advise his PCP about the numbness/burning in rt thigh. ?

## 2021-08-12 NOTE — Telephone Encounter (Signed)
Patient said he has some numbness in his R thigh. He is not having any other symptoms. It started after he was discharged from the hospital. He is not sure what to do  ?

## 2021-08-13 NOTE — Telephone Encounter (Signed)
Spoke with pt, Follow up scheduled with dr Stanford Breed for post hospital visit. ?

## 2021-08-15 ENCOUNTER — Other Ambulatory Visit: Payer: Self-pay

## 2021-08-15 ENCOUNTER — Telehealth (HOSPITAL_COMMUNITY): Payer: Self-pay

## 2021-08-15 DIAGNOSIS — Z79899 Other long term (current) drug therapy: Secondary | ICD-10-CM

## 2021-08-15 NOTE — Telephone Encounter (Signed)
Transitions of Care Pharmacy  ° °Call attempted for a pharmacy transitions of care follow-up. HIPAA appropriate voicemail was left with call back information provided.  ° °Call attempt #1. Will follow-up in 2-3 days.  °  °

## 2021-08-16 LAB — BASIC METABOLIC PANEL
BUN/Creatinine Ratio: 20 (ref 10–24)
BUN: 18 mg/dL (ref 8–27)
CO2: 26 mmol/L (ref 20–29)
Calcium: 10.3 mg/dL — ABNORMAL HIGH (ref 8.6–10.2)
Chloride: 102 mmol/L (ref 96–106)
Creatinine, Ser: 0.88 mg/dL (ref 0.76–1.27)
Glucose: 91 mg/dL (ref 70–99)
Potassium: 4.9 mmol/L (ref 3.5–5.2)
Sodium: 142 mmol/L (ref 134–144)
eGFR: 84 mL/min/{1.73_m2} (ref >59)

## 2021-08-18 ENCOUNTER — Other Ambulatory Visit (HOSPITAL_COMMUNITY): Payer: Self-pay

## 2021-08-19 ENCOUNTER — Telehealth (HOSPITAL_COMMUNITY): Payer: Self-pay

## 2021-08-19 ENCOUNTER — Other Ambulatory Visit (HOSPITAL_COMMUNITY): Payer: Self-pay

## 2021-08-19 NOTE — Telephone Encounter (Signed)
Pharmacy Transitions of Care Follow-up Telephone Call ? ?Date of discharge: 08/08/21  ?Discharge Diagnosis: Afib ? ?How have you been since you were released from the hospital? Patient doing well since discharge, no questions about meds at this time  ? ?Medication changes made at discharge: ?amiodarone 200 MG tablet ?Commonly known as: Pacerone Take 1 tablet (200 mg total) by mouth daily.  ?atorvastatin 40 MG tablet ?Commonly known as: LIPITOR Take 40 mg by mouth daily.  ?Eliquis 5 MG Tabs tablet ?Generic drug: apixaban Take 1 tablet (5 mg total) by mouth 2 (two) times daily.  ?fluticasone 27.5 MCG/SPRAY nasal spray ?Commonly known as: VERAMYST Place 2 sprays into the nose daily.  ?glimepiride 2 MG tablet ?Commonly known as: AMARYL Take 2 mg by mouth daily with breakfast.  ?ICAPS AREDS 2 PO Take 1 tablet by mouth daily.  ?losartan 25 MG tablet ?Commonly known as: COZAAR Take 1 tablet (25 mg total) by mouth daily.  ?metFORMIN 500 MG 24 hr tablet ?Commonly known as: GLUCOPHAGE-XR Take 3 tablets (1,500 mg total) by mouth daily. Resume on 08/10/21.  ?nitroGLYCERIN 0.4 MG SL tablet ?Commonly known as: NITROSTAT Place 1 tablet (0.4 mg total) under the tongue every 5 (five) minutes x 3 doses as needed for chest pain.  ?pyridOXINE 100 MG tablet ?Commonly known as: VITAMIN B-6 Take 100 mg by mouth daily.  ?vitamin B-12 500 MCG tablet ?Commonly known as: CYANOCOBALAMIN Take 500 mcg by mouth daily.  ?Vitamin D 50 MCG (2000 UT) Caps Take 1 capsule by mouth daily.  ? ? ?Medication changes verified by the patient? Yes ?  ? ?Medication Accessibility: ? ?Home Pharmacy: [id number]  ? ?Was the patient provided with refills on discharged medications? Yes  ? ?Have all prescriptions been transferred from [facility] to home pharmacy? Yes  ? ?Is the patient able to afford medications?  ?  ? ?Medication Review: ? ?APIXABAN (ELIQUIS)  ?Apixaban 5 mg BID initiated on 08/08/21.  ?- Discussed importance of taking medication around the same  time everyday  ?- Advised patient of medications to avoid (NSAIDs, ASA)  ?- Educated that Tylenol (acetaminophen) will be the preferred analgesic to prevent risk of bleeding  ?- Emphasized importance of monitoring for signs and symptoms of bleeding (abnormal bruising, prolonged bleeding, nose bleeds, bleeding from gums, discolored urine, black tarry stools)  ?- Advised patient to alert all providers of anticoagulation therapy prior to starting a new medication or having a procedure  ? ? ?Follow-up Appointments: ? ?[facility] f/u appt confirmed? Scheduled to see Dr. Stanford Breed on 09/02/21 @ 11AM.  ? ?If their condition worsens, is the pt aware to call PCP or go to the Emergency Dept.? Yes ? ?Final Patient Assessment: ?Patient has f/u scheduled and refills at home pharmacy ? ?

## 2021-08-27 NOTE — Progress Notes (Signed)
? ? ? ? ?HPI: Evaluate atrial fibrillation, syncope and FU coronary artery disease.  Patient apparently had angioplasty of unknown vessel at [facility] in the 1980s.  Patient seen in the office March 2023 following syncopal episode.  He was admitted.  Echocardiogram revealed ejection fraction 35 to 36%, grade 2 diastolic dysfunction, moderate left atrial enlargement, mild mitral regurgitation and trace aortic insufficiency.  Cardiac catheterization revealed 90% mid LAD, 60% proximal LAD and 80% third posterior lateral.  Given that patient was not having chest pain he has been treated medically.  During that admission the patient developed atrial fibrillation with rapid ventricular response.  He was treated with IV amiodarone and converted to sinus rhythm.  He was discharged with an outpatient monitor which was performed March 2023 and showed sinus rhythm with PACs, PAT, paroxysmal atrial fibrillation and 7 beats nonsustained ventricular tachycardia.  Since last seen he has occasional dizziness that lasts 15 minutes continuously with no associated symptoms.  He states it is not mild.  It is not related to position.  He denies dyspnea, syncope, chest pain or pedal edema. ? ?Current Outpatient Medications  ?Medication Sig Dispense Refill  ? amiodarone (PACERONE) 200 MG tablet Take 1 tablet (200 mg total) by mouth daily. 90 tablet 3  ? apixaban (ELIQUIS) 5 MG TABS tablet Take 1 tablet (5 mg total) by mouth 2 (two) times daily. 180 tablet 3  ? atorvastatin (LIPITOR) 40 MG tablet Take 40 mg by mouth daily.    ? Cholecalciferol (VITAMIN D) 50 MCG (2000 UT) CAPS Take 1 capsule by mouth daily.    ? fluticasone (VERAMYST) 27.5 MCG/SPRAY nasal spray Place 2 sprays into the nose daily.    ? glimepiride (AMARYL) 2 MG tablet Take 2 mg by mouth daily with breakfast.    ? losartan (COZAAR) 25 MG tablet Take 1 tablet (25 mg total) by mouth daily. 90 tablet 3  ? metFORMIN (GLUCOPHAGE-XR) 500 MG 24 hr tablet Take 3 tablets (1,500 mg  total) by mouth daily. Resume on 08/10/21.    ? Multiple Vitamins-Minerals (ICAPS AREDS 2 PO) Take 1 tablet by mouth daily.    ? nitroGLYCERIN (NITROSTAT) 0.4 MG SL tablet Place 1 tablet (0.4 mg total) under the tongue every 5 (five) minutes x 3 doses as needed for chest pain. 25 tablet 3  ? pyridOXINE (VITAMIN B-6) 100 MG tablet Take 100 mg by mouth daily.    ? vitamin B-12 (CYANOCOBALAMIN) 500 MCG tablet Take 500 mcg by mouth daily.    ? ?No current facility-administered medications for this visit.  ? ? ? ?Past Medical History:  ?Diagnosis Date  ? CAD (coronary artery disease)   ? Diabetes mellitus without complication ([place])   ? Hypercholesteremia   ? Hypertension   ? Macular degeneration   ? Pancreatitis   ? Prostate cancer ([place])   ? ? ?Past Surgical History:  ?Procedure Laterality Date  ? APPENDECTOMY    ? BRAIN SURGERY    ? CHOLECYSTECTOMY    ? KNEE SURGERY    ? PROSTATECTOMY    ? RIGHT/LEFT HEART CATH AND CORONARY ANGIOGRAPHY N/A 08/07/2021  ? Procedure: RIGHT/LEFT HEART CATH AND CORONARY ANGIOGRAPHY;  Surgeon: Early Osmond, MD;  Location: [facility];  Service: Cardiovascular;  Laterality: N/A;  ? TONSILLECTOMY    ? TUMOR REMOVAL Right   ? KNEE  ? ? ?Social History  ? ?Socioeconomic History  ? Marital status: Married  ?  Spouse name: Not on file  ? Number  of children: 2  ? Years of education: Not on file  ? Highest education level: Not on file  ?Occupational History  ? Not on file  ?Tobacco Use  ? Smoking status: Former  ?  Types: Cigarettes  ? Smokeless tobacco: Never  ?Vaping Use  ? Vaping Use: Never used  ?Substance and Sexual Activity  ? Alcohol use: Yes  ?  Comment: Occasional  ? Drug use: Never  ? Sexual activity: Not on file  ?Other Topics Concern  ? Not on file  ?Social History Narrative  ? Not on file  ? ?Social Determinants of Health  ? ?Financial Resource Strain: Not on file  ?Food Insecurity: Not on file  ?Transportation Needs: Not on file  ?Physical Activity: Not on file  ?Stress: Not on  file  ?Social Connections: Not on file  ?Intimate Partner Violence: Not on file  ? ? ?Family History  ?Problem Relation Age of Onset  ? Heart attack Brother   ? ? ?ROS: no fevers or chills, productive cough, hemoptysis, dysphasia, odynophagia, melena, hematochezia, dysuria, hematuria, rash, seizure activity, orthopnea, PND, pedal edema, claudication. Remaining systems are negative. ? ?Physical Exam: ?Well-developed well-nourished in no acute distress.  ?Skin is warm and dry.  ?HEENT is normal.  ?Neck is supple.  ?Chest is clear to auscultation with normal expansion.  ?Cardiovascular exam is regular rate and rhythm.  ?Abdominal exam nontender or distended. No masses palpated. ?Extremities show no edema. ?neuro grossly intact ? ?ECG-sinus bradycardia at a rate of 49, no ST changes.  Personally reviewed ? ?A/P ? ?1 paroxysmal atrial fibrillation-patient remains in sinus rhythm.  Continue amiodarone and apixaban.  We will plan to check TSH, liver functions and chest x-ray in 3 months.  Note he has some degree of bradycardia and we will need to follow this closely. ? ?2 recent syncope-recent monitor showed no prolonged pauses.  He has had mild dizziness for 15 continuous minutes not related to position.  This does not sound cardiac related but I have asked him to track his blood pressure and pulse during that time.  We also discussed an Apple Watch to record rhythm strips in the future.  Patient instructed not to drive for 6 months following his previous event. ? 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  Plan to treat medically.  We will continue ARB.  Given his dizziness I am hesitant to change to [facility] at this point.  Beta-blocker has been avoided as he has baseline bradycardia. ? ?4 coronary artery disease-we will not treat with aspirin given need for apixaban.  Continue statin. ? ?5 hyperlipidemia-continue statin.  We will check lipids and liver when he returns in 12 weeks. ? ?6  hypertension-continue present blood pressure medications. ? ?Kirk Ruths, MD ? ? ? ?

## 2021-09-02 ENCOUNTER — Ambulatory Visit (INDEPENDENT_AMBULATORY_CARE_PROVIDER_SITE_OTHER): Payer: Medicare Other | Admitting: Cardiology

## 2021-09-02 ENCOUNTER — Encounter: Payer: Self-pay | Admitting: Cardiology

## 2021-09-02 VITALS — BP 130/58 | HR 49 | Ht 69.5 in | Wt 165.0 lb

## 2021-09-02 DIAGNOSIS — I2510 Atherosclerotic heart disease of native coronary artery without angina pectoris: Secondary | ICD-10-CM

## 2021-09-02 DIAGNOSIS — E7800 Pure hypercholesterolemia, unspecified: Secondary | ICD-10-CM

## 2021-09-02 DIAGNOSIS — R55 Syncope and collapse: Secondary | ICD-10-CM

## 2021-09-02 DIAGNOSIS — I480 Paroxysmal atrial fibrillation: Secondary | ICD-10-CM | POA: Diagnosis not present

## 2021-09-02 DIAGNOSIS — I10 Essential (primary) hypertension: Secondary | ICD-10-CM | POA: Diagnosis not present

## 2021-09-02 MED ORDER — AMIODARONE HCL 200 MG PO TABS: 200.0000 mg | ORAL_TABLET | Freq: Every day | ORAL | 3 refills | Status: DC

## 2021-09-02 NOTE — Addendum Note (Signed)
Encounter addended by: Gerarda Gunther on: 09/02/2021 8:09 AM ? Actions taken: Imaging Exam ended

## 2021-09-02 NOTE — Patient Instructions (Signed)
  Follow-Up: At [facility], you and your health needs are our priority.  As part of our continuing mission to provide you with exceptional heart care, we have created designated Provider Care Teams.  These Care Teams include your primary Cardiologist (physician) and Advanced Practice Providers (APPs -  Physician Assistants and Nurse Practitioners) who all work together to provide you with the care you need, when you need it.  We recommend signing up for the patient portal called "MyChart".  Sign up information is provided on this After Visit Summary.  MyChart is used to connect with patients for Virtual Visits (Telemedicine).  Patients are able to view lab/test results, encounter notes, upcoming appointments, etc.  Non-urgent messages can be sent to your provider as well.   To learn more about what you can do with MyChart, go to [email or web address].    Your next appointment:   3 month(s)  The format for your next appointment:   In Person  Provider:   Brian Crenshaw, MD     Important Information About Sugar       

## 2021-09-08 ENCOUNTER — Telehealth: Payer: Self-pay

## 2021-09-08 NOTE — Telephone Encounter (Signed)
PT calls today in regards to establishing care with Dr.Crawford! Their current PCP is in [place] and they had just got moved down here. I had set up a new patient appointment for him and his wife for 10/10 and 10/11.He was wanting to see if they could both be seen on the same day. Due to them being a couple is there anyway they could both be seen on that Tuesday 10/10? ?

## 2021-09-09 NOTE — Telephone Encounter (Signed)
Okay with me but can we do 10:40 and 11:00 please, thanks.  ?

## 2021-09-09 NOTE — Telephone Encounter (Signed)
Spoke with the patient to make him aware that him and his wife would be seen together on 02/17/2022 at 10:40 and and 11:00 am. He was very appreciative for the call. No questions or concerns.  ?

## 2021-10-18 ENCOUNTER — Encounter: Payer: Self-pay | Admitting: Cardiology

## 2021-12-05 ENCOUNTER — Ambulatory Visit: Payer: Medicare Other | Admitting: Cardiology

## 2021-12-19 ENCOUNTER — Encounter: Payer: Self-pay | Admitting: Cardiology

## 2021-12-19 NOTE — Telephone Encounter (Signed)
Spoke to patient he wanted Dr.Crenshaw to know he had a episode of heart fluttering this past Tuesday, lasted only a few min.No chest pain.No Sob.He has been under alittle stress.Advised to keep appointment with Dr.Crenshaw 8/24 at 3:30 pm.Advised to call sooner if he has any more heart flutters.

## 2021-12-31 NOTE — Progress Notes (Unsigned)
HPI: FU atrial fibrillation, syncope and FU coronary artery disease.  Patient apparently had angioplasty of unknown vessel at [facility] in the 1980s.  Patient seen in the office March 2023 following syncopal episode.  He was admitted.  Echocardiogram revealed ejection fraction 35 to 02%, grade 2 diastolic dysfunction, moderate left atrial enlargement, mild mitral regurgitation and trace aortic insufficiency.  Cardiac catheterization revealed 90% mid LAD, 60% proximal LAD and 80% third posterior lateral.  Given that patient was not having chest pain he has been treated medically.  During that admission the patient developed atrial fibrillation with rapid ventricular response.  He was treated with IV amiodarone and converted to sinus rhythm.  He was discharged with an outpatient monitor which was performed March 2023 and showed sinus rhythm with PACs, PAT, paroxysmal atrial fibrillation and 7 beats nonsustained ventricular tachycardia.  Since last seen he denies dyspnea, chest pain, palpitations or syncope.  He has fallen several times because he lost his balance.  Current Outpatient Medications  Medication Sig Dispense Refill   amiodarone (PACERONE) 200 MG tablet Take 1 tablet (200 mg total) by mouth daily. 90 tablet 3   apixaban (ELIQUIS) 5 MG TABS tablet Take 1 tablet (5 mg total) by mouth 2 (two) times daily. 180 tablet 3   atorvastatin (LIPITOR) 40 MG tablet Take 40 mg by mouth daily.     Cholecalciferol (VITAMIN D) 50 MCG (2000 UT) CAPS Take 1 capsule by mouth daily.     fluticasone (VERAMYST) 27.5 MCG/SPRAY nasal spray Place 2 sprays into the nose daily.     glimepiride (AMARYL) 2 MG tablet Take 2 mg by mouth daily with breakfast.     losartan (COZAAR) 25 MG tablet Take 1 tablet (25 mg total) by mouth daily. 90 tablet 3   metFORMIN (GLUCOPHAGE-XR) 500 MG 24 hr tablet Take 3 tablets (1,500 mg total) by mouth daily. Resume on 08/10/21.     Multiple Vitamins-Minerals (ICAPS AREDS 2 PO) Take 1  tablet by mouth daily.     nitroGLYCERIN (NITROSTAT) 0.4 MG SL tablet Place 1 tablet (0.4 mg total) under the tongue every 5 (five) minutes x 3 doses as needed for chest pain. 25 tablet 3   pyridOXINE (VITAMIN B-6) 100 MG tablet Take 100 mg by mouth daily.     vitamin B-12 (CYANOCOBALAMIN) 500 MCG tablet Take 500 mcg by mouth daily.     No current facility-administered medications for this visit.     Past Medical History:  Diagnosis Date   CAD (coronary artery disease)    Diabetes mellitus without complication ([place])    Hypercholesteremia    Hypertension    Macular degeneration    Pancreatitis    Prostate cancer ([place])     Past Surgical History:  Procedure Laterality Date   APPENDECTOMY     BRAIN SURGERY     CHOLECYSTECTOMY     KNEE SURGERY     PROSTATECTOMY     RIGHT/LEFT HEART CATH AND CORONARY ANGIOGRAPHY N/A 08/07/2021   Procedure: RIGHT/LEFT HEART CATH AND CORONARY ANGIOGRAPHY;  Surgeon: Early Osmond, MD;  Location: [facility];  Service: Cardiovascular;  Laterality: N/A;   TONSILLECTOMY     TUMOR REMOVAL Right    KNEE    Social History   Socioeconomic History   Marital status: Married    Spouse name: Not on file   Number of children: 2   Years of education: Not on file   Highest education level: Not on  file  Occupational History   Not on file  Tobacco Use   Smoking status: Former    Types: Cigarettes   Smokeless tobacco: Never  Vaping Use   Vaping Use: Never used  Substance and Sexual Activity   Alcohol use: Yes    Comment: Occasional   Drug use: Never   Sexual activity: Not on file  Other Topics Concern   Not on file  Social History Narrative   Not on file   Social Determinants of Health   Financial Resource Strain: Not on file  Food Insecurity: Not on file  Transportation Needs: Not on file  Physical Activity: Not on file  Stress: Not on file  Social Connections: Not on file  Intimate Partner Violence: Not on file    Family  History  Problem Relation Age of Onset   Heart attack Brother     ROS: no fevers or chills, productive cough, hemoptysis, dysphasia, odynophagia, melena, hematochezia, dysuria, hematuria, rash, seizure activity, orthopnea, PND, pedal edema, claudication. Remaining systems are negative.  Physical Exam: Well-developed well-nourished in no acute distress.  Skin is warm and dry.  HEENT is normal.  Neck is supple.  Chest is clear to auscultation with normal expansion.  Cardiovascular exam is regular rate and rhythm.  Abdominal exam nontender or distended. No masses palpated. Extremities show no edema. neuro grossly intact  ECG-sinus bradycardia at a rate of 50, no ST changes.  RV conduction delay.  Personally reviewed  A/P  1 paroxysmal atrial fibrillation-patient is in sinus rhythm today.  Continue amiodarone and apixaban.  Note his heart rate is 50 but he states runs 45 at home.  Decrease amiodarone to 100 mg daily.  Check TSH, liver functions and chest x-ray.  Check hemoglobin and renal function.  2 history of syncope-previous monitor showed no prolonged pauses.  He has had no recurrences.  We again discussed that he should not drive for 6 months following his most recent episode.  3 cardiomyopathy-felt to be mixed ischemic/nonischemic.  Plan medical therapy.  Continue ARB but will increase to 50 mg daily.  Will can consider changing to [facility] if LV function remains decreased on follow-up imaging.  Beta-blocker has been avoided due to baseline bradycardia.  Repeat echocardiogram to reassess LV function.  4 coronary artery disease-continue statin.  No aspirin given need for anticoagulation.  5 hyperlipidemia-continue statin.  Check lipids and liver.  6 history of hypertension-patient's blood pressure is controlled.  Continue present medical regimen.  Kirk Ruths, MD

## 2022-01-01 ENCOUNTER — Encounter: Payer: Self-pay | Admitting: Cardiology

## 2022-01-01 ENCOUNTER — Ambulatory Visit (INDEPENDENT_AMBULATORY_CARE_PROVIDER_SITE_OTHER): Payer: Medicare Other | Admitting: Cardiology

## 2022-01-01 VITALS — BP 140/62 | HR 50 | Ht 70.0 in | Wt 167.4 lb

## 2022-01-01 DIAGNOSIS — I2510 Atherosclerotic heart disease of native coronary artery without angina pectoris: Secondary | ICD-10-CM | POA: Diagnosis not present

## 2022-01-01 DIAGNOSIS — I480 Paroxysmal atrial fibrillation: Secondary | ICD-10-CM

## 2022-01-01 DIAGNOSIS — E7800 Pure hypercholesterolemia, unspecified: Secondary | ICD-10-CM

## 2022-01-01 DIAGNOSIS — I10 Essential (primary) hypertension: Secondary | ICD-10-CM

## 2022-01-01 DIAGNOSIS — R55 Syncope and collapse: Secondary | ICD-10-CM

## 2022-01-01 MED ORDER — LOSARTAN POTASSIUM 50 MG PO TABS: 50.0000 mg | ORAL_TABLET | Freq: Every day | ORAL | 1 refills | Status: DC

## 2022-01-01 MED ORDER — AMIODARONE HCL 100 MG PO TABS: 100.0000 mg | ORAL_TABLET | Freq: Every day | ORAL | 1 refills | Status: DC

## 2022-01-01 NOTE — Patient Instructions (Signed)
Medication Instructions:  DECREASE the Amiodarone to 100 mg once daily INCREASE the Losartan to 50 mg once daily  *If you need a refill on your cardiac medications before your next appointment, please call your pharmacy*   Lab Work: Your provider would like for you to return in one week to have the following labs drawn: TSH, BMET, LIPID, LIVER, CBC. You do not need an appointment for the lab. Once in our office lobby there is a podium where you can sign in and ring the doorbell to alert [place] that you are here. The lab is open from 8:00 am to 4 pm; closed for lunch from 12:45pm-1:45pm.    If you have labs (blood work) drawn today and your tests are completely normal, you will receive your results only by: [place] (if you have MyChart) OR A paper copy in the mail If you have any lab test that is abnormal or we need to change your treatment, we will call you to review the results.   Testing/Procedures: Your physician has requested that you have an echocardiogram. Echocardiography is a painless test that uses sound waves to create images of your heart. It provides your doctor with information about the size and shape of your heart and how well your heart's chambers and valves are working. You may receive an ultrasound enhancing agent through an IV if needed to better visualize your heart during the echo.This procedure takes approximately one hour. There are no restrictions for this procedure. This will take place at the [address].   A chest x-ray takes a picture of the organs and structures inside the chest, including the heart, lungs, and blood vessels. This test can show several things, including, whether the heart is enlarges; whether fluid is building up in the lungs; and whether pacemaker / defibrillator leads are still in place. This can be done at [place]. You do not need an appointment. [address]   Follow-Up: At [facility], you and your  health needs are our priority.  As part of our continuing mission to provide you with exceptional heart care, we have created designated Provider Care Teams.  These Care Teams include your primary Cardiologist (physician) and Advanced Practice Providers (APPs -  Physician Assistants and Nurse Practitioners) who all work together to provide you with the care you need, when you need it.  We recommend signing up for the patient portal called "MyChart".  Sign up information is provided on this After Visit Summary.  MyChart is used to connect with patients for Virtual Visits (Telemedicine).  Patients are able to view lab/test results, encounter notes, upcoming appointments, etc.  Non-urgent messages can be sent to your provider as well.   To learn more about what you can do with MyChart, go to [email or web address].    Your next appointment:   6 month(s)  The format for your next appointment:   In Person  Provider:   Kirk Ruths, MD {   Important Information About Sugar

## 2022-01-05 ENCOUNTER — Encounter: Payer: Self-pay | Admitting: Cardiology

## 2022-01-06 MED ORDER — AMIODARONE HCL 200 MG PO TABS
100.0000 mg | ORAL_TABLET | Freq: Every day | ORAL | 2 refills | Status: DC
Start: 2022-01-06 — End: 2022-12-21

## 2022-01-14 ENCOUNTER — Encounter: Payer: Self-pay | Admitting: Cardiology

## 2022-01-19 ENCOUNTER — Other Ambulatory Visit: Payer: Medicare Other

## 2022-01-19 ENCOUNTER — Other Ambulatory Visit (HOSPITAL_COMMUNITY): Payer: Medicare Other

## 2022-01-26 ENCOUNTER — Ambulatory Visit (HOSPITAL_COMMUNITY): Payer: Medicare Other | Attending: Cardiology

## 2022-01-26 ENCOUNTER — Ambulatory Visit: Payer: Medicare Other

## 2022-01-26 ENCOUNTER — Ambulatory Visit
Admission: RE | Admit: 2022-01-26 | Discharge: 2022-01-26 | Disposition: A | Discharge status: Home / Self Care | Payer: Medicare Other | Source: Ambulatory Visit | Attending: Cardiology | Admitting: Cardiology

## 2022-01-26 DIAGNOSIS — I480 Paroxysmal atrial fibrillation: Secondary | ICD-10-CM | POA: Diagnosis present

## 2022-01-26 LAB — ECHOCARDIOGRAM COMPLETE
Area-P 1/2: 3.72 cm2
P 1/2 time: 714 msec
S' Lateral: 4.25 cm

## 2022-01-27 ENCOUNTER — Ambulatory Visit: Payer: Medicare Other | Attending: Interventional Cardiology

## 2022-01-28 ENCOUNTER — Other Ambulatory Visit: Payer: Self-pay | Admitting: *Deleted

## 2022-01-28 LAB — BASIC METABOLIC PANEL
BUN/Creatinine Ratio: 19 (ref 10–24)
BUN: 22 mg/dL (ref 8–27)
CO2: 27 mmol/L (ref 20–29)
Calcium: 9.6 mg/dL (ref 8.6–10.2)
Chloride: 103 mmol/L (ref 96–106)
Creatinine, Ser: 1.15 mg/dL (ref 0.76–1.27)
Glucose: 115 mg/dL — ABNORMAL HIGH (ref 70–99)
Potassium: 4.7 mmol/L (ref 3.5–5.2)
Sodium: 144 mmol/L (ref 134–144)
eGFR: 62 mL/min/{1.73_m2} (ref >59)

## 2022-01-28 LAB — HEPATIC FUNCTION PANEL
ALT: 46 IU/L — ABNORMAL HIGH (ref 0–44)
AST: 31 IU/L (ref 0–40)
Albumin: 4.4 g/dL (ref 3.7–4.7)
Alkaline Phosphatase: 132 IU/L — ABNORMAL HIGH (ref 44–121)
Bilirubin Total: 0.4 mg/dL (ref 0.0–1.2)
Bilirubin, Direct: 0.17 mg/dL (ref 0.00–0.40)
Total Protein: 6.9 g/dL (ref 6.0–8.5)

## 2022-01-28 LAB — CBC
Hematocrit: 40.4 % (ref 37.5–51.0)
Hemoglobin: 13.5 g/dL (ref 13.0–17.7)
MCH: 30.1 pg (ref 26.6–33.0)
MCHC: 33.4 g/dL (ref 31.5–35.7)
MCV: 90 fL (ref 79–97)
Platelets: 309 10*3/uL (ref 150–450)
RBC: 4.49 x10E6/uL (ref 4.14–5.80)
RDW: 13.3 % (ref 11.6–15.4)
WBC: 8 10*3/uL (ref 3.4–10.8)

## 2022-01-28 LAB — LIPID PANEL
Chol/HDL Ratio: 2.6 ratio (ref 0.0–5.0)
Cholesterol, Total: 135 mg/dL (ref 100–199)
HDL: 51 mg/dL (ref >39)
LDL Chol Calc (NIH): 67 mg/dL (ref 0–99)
Triglycerides: 92 mg/dL (ref 0–149)
VLDL Cholesterol Cal: 17 mg/dL (ref 5–40)

## 2022-01-28 LAB — TSH: TSH: 10.1 u[IU]/mL — ABNORMAL HIGH (ref 0.450–4.500)

## 2022-01-29 ENCOUNTER — Telehealth: Payer: Self-pay | Admitting: *Deleted

## 2022-01-29 DIAGNOSIS — E7800 Pure hypercholesterolemia, unspecified: Secondary | ICD-10-CM

## 2022-01-29 DIAGNOSIS — I480 Paroxysmal atrial fibrillation: Secondary | ICD-10-CM

## 2022-01-29 MED ORDER — ATORVASTATIN CALCIUM 20 MG PO TABS: 20.0000 mg | ORAL_TABLET | Freq: Every day | ORAL | 3 refills | Status: DC

## 2022-01-29 MED ORDER — EZETIMIBE 10 MG PO TABS: 10.0000 mg | ORAL_TABLET | Freq: Every day | ORAL | 3 refills | Status: DC

## 2022-01-29 NOTE — Telephone Encounter (Signed)
Patient is requesting to speak with RN again. He states he has a question regarding liver function results. He will be leaving his house at 1:00 PM--will return around 2:00 PM.

## 2022-01-29 NOTE — Telephone Encounter (Signed)
Spoke with pt, he wanted to let us know that 4 months ago his alt was 23. Aware that is why the lipitor has been decreased.

## 2022-01-29 NOTE — Telephone Encounter (Signed)
Spoke with pt, Aware of dr crenshaw's recommendations.  ?New script sent to the pharmacy  ?Lab orders mailed to the pt  ?

## 2022-01-29 NOTE — Telephone Encounter (Signed)
-----   Message from [name], MD sent at 01/28/2022  8:31 AM EDT ----- LFTs mildly increased.  Decrease Lipitor to 20 mg daily and add Zetia 10 mg daily.  Check lipids and liver in 8 weeks.  TSH mildly elevated likely secondary to amiodarone.  In 8 weeks check free T4 and TSH. Vincent Howell

## 2022-02-16 ENCOUNTER — Encounter: Payer: Self-pay | Admitting: Cardiology

## 2022-02-16 DIAGNOSIS — R739 Hyperglycemia, unspecified: Secondary | ICD-10-CM

## 2022-02-16 DIAGNOSIS — E7800 Pure hypercholesterolemia, unspecified: Secondary | ICD-10-CM

## 2022-02-17 ENCOUNTER — Ambulatory Visit: Payer: Medicare Other | Admitting: Internal Medicine

## 2022-02-17 ENCOUNTER — Ambulatory Visit (INDEPENDENT_AMBULATORY_CARE_PROVIDER_SITE_OTHER): Payer: Medicare Other | Admitting: Internal Medicine

## 2022-02-17 VITALS — BP 120/50 | HR 100 | Temp 97.7°F | Ht 71.0 in | Wt 166.1 lb

## 2022-02-17 DIAGNOSIS — Z23 Encounter for immunization: Secondary | ICD-10-CM | POA: Diagnosis not present

## 2022-02-17 DIAGNOSIS — I2510 Atherosclerotic heart disease of native coronary artery without angina pectoris: Secondary | ICD-10-CM | POA: Diagnosis not present

## 2022-02-17 DIAGNOSIS — E1169 Type 2 diabetes mellitus with other specified complication: Secondary | ICD-10-CM

## 2022-02-17 DIAGNOSIS — E785 Hyperlipidemia, unspecified: Secondary | ICD-10-CM | POA: Diagnosis not present

## 2022-02-17 DIAGNOSIS — E118 Type 2 diabetes mellitus with unspecified complications: Secondary | ICD-10-CM

## 2022-02-17 NOTE — Progress Notes (Signed)
   Subjective:   Patient ID: Vincent Howell, male    DOB: Sep 09, 1932, 88 y.o.   MRN: [id number]  HPI The patient is a new 88 YO man coming in for ongoing care.  PMH, [facility], social history reviewed and updated  Review of Systems  Constitutional: Negative.   HENT: Negative.    Eyes: Negative.   Respiratory:  Negative for cough, chest tightness and shortness of breath.   Cardiovascular:  Negative for chest pain, palpitations and leg swelling.  Gastrointestinal:  Negative for abdominal distention, abdominal pain, constipation, diarrhea, nausea and vomiting.  Musculoskeletal: Negative.   Skin: Negative.   Neurological: Negative.   Psychiatric/Behavioral: Negative.      Objective:  Physical Exam Constitutional:      Appearance: He is well-developed.  HENT:     Head: Normocephalic and atraumatic.  Cardiovascular:     Rate and Rhythm: Normal rate and regular rhythm.  Pulmonary:     Effort: Pulmonary effort is normal. No respiratory distress.     Breath sounds: Normal breath sounds. No wheezing or rales.  Abdominal:     General: Bowel sounds are normal. There is no distension.     Palpations: Abdomen is soft.     Tenderness: There is no abdominal tenderness. There is no rebound.  Musculoskeletal:     Cervical back: Normal range of motion.  Skin:    General: Skin is warm and dry.  Neurological:     Mental Status: He is alert and oriented to person, place, and time.     Coordination: Coordination normal.     Vitals:   02/17/22 1052  BP: (!) 120/50  Pulse: 100  Temp: 97.7 F (36.5 C)  TempSrc: Oral  SpO2: 95%  Weight: 166 lb 2 oz (75.4 kg)  Height: '5\' 11"'$  (1.803 m)    Assessment & Plan:  Flu shot given at visit

## 2022-02-17 NOTE — Patient Instructions (Signed)
We will review the labs from the heart doctor.

## 2022-02-20 ENCOUNTER — Encounter: Payer: Self-pay | Admitting: Internal Medicine

## 2022-02-20 DIAGNOSIS — E118 Type 2 diabetes mellitus with unspecified complications: Secondary | ICD-10-CM | POA: Insufficient documentation

## 2022-02-20 NOTE — Assessment & Plan Note (Signed)
Taking amaryl 2 mg daily and metformin 1500 mg daily xr. Offered labs today but he wishes to have them in November with cardiology lab draw. He would manage levels and he understands that. Last HgA1c 6.2 and possibly over controlled. No low sugars reported so no urgent need for change. Is on lipitor and losartan.

## 2022-02-20 NOTE — Assessment & Plan Note (Signed)
Taking lipitor 20 mg daily and getting lipid panel in Nov with cardiology. Adjust for <70 LDL goal.

## 2022-03-19 ENCOUNTER — Ambulatory Visit: Payer: Medicare Other | Admitting: Cardiology

## 2022-03-25 LAB — TSH+FREE T4
Free T4: 1.25 ng/dL (ref 0.82–1.77)
TSH: 8.62 u[IU]/mL — ABNORMAL HIGH (ref 0.450–4.500)

## 2022-03-25 LAB — HEPATIC FUNCTION PANEL
ALT: 24 IU/L (ref 0–44)
AST: 24 IU/L (ref 0–40)
Albumin: 4.3 g/dL (ref 3.7–4.7)
Alkaline Phosphatase: 111 IU/L (ref 44–121)
Bilirubin Total: 0.5 mg/dL (ref 0.0–1.2)
Bilirubin, Direct: 0.21 mg/dL (ref 0.00–0.40)
Total Protein: 6.8 g/dL (ref 6.0–8.5)

## 2022-03-25 LAB — LIPID PANEL
Chol/HDL Ratio: 2.3 ratio (ref 0.0–5.0)
Cholesterol, Total: 117 mg/dL (ref 100–199)
HDL: 51 mg/dL (ref >39)
LDL Chol Calc (NIH): 53 mg/dL (ref 0–99)
Triglycerides: 60 mg/dL (ref 0–149)
VLDL Cholesterol Cal: 13 mg/dL (ref 5–40)

## 2022-03-26 LAB — HEMOGLOBIN A1C
Est. average glucose Bld gHb Est-mCnc: 123 mg/dL
Hgb A1c MFr Bld: 5.9 % — ABNORMAL HIGH (ref 4.8–5.6)

## 2022-03-27 ENCOUNTER — Encounter: Payer: Self-pay | Admitting: Internal Medicine

## 2022-04-07 ENCOUNTER — Telehealth: Payer: Self-pay | Admitting: Cardiology

## 2022-04-07 ENCOUNTER — Encounter: Payer: Self-pay | Admitting: Internal Medicine

## 2022-04-07 NOTE — Telephone Encounter (Signed)
*  STAT* If patient is at the pharmacy, call can be transferred to refill team.   1. Which medications need to be refilled? (please list name o  metFORMIN (GLUCOPHAGE-XR) 500 MG 24 hr tablet    2. Which pharmacy/location (including street and city if local pharmacy) is medication to be sent to?   [id number] Phone: [phone]         3. Do they need a 30 day or 90 day supply?  90 day

## 2022-04-08 MED ORDER — METFORMIN HCL ER 500 MG PO TB24: 1500.0000 mg | ORAL_TABLET | Freq: Every day | ORAL | 3 refills | Status: DC

## 2022-04-09 MED ORDER — METFORMIN HCL ER 500 MG PO TB24: 1500.0000 mg | ORAL_TABLET | Freq: Every day | ORAL | 3 refills | Status: DC

## 2022-04-09 NOTE — Addendum Note (Signed)
Addended by: Pricilla Holm A on: 04/09/2022 09:44 AM   Modules accepted: Orders

## 2022-04-23 ENCOUNTER — Telehealth: Payer: Self-pay | Admitting: Internal Medicine

## 2022-04-23 NOTE — Telephone Encounter (Signed)
04/23/22 Appointment scheduled for 04/24/22 has been canceled due to schedule change, LM informing patient it has been canceled and to please call the office to reschedule. PJR

## 2022-04-24 ENCOUNTER — Ambulatory Visit: Payer: Medicare Other

## 2022-05-30 ENCOUNTER — Encounter: Payer: Self-pay | Admitting: Cardiology

## 2022-06-03 ENCOUNTER — Ambulatory Visit (INDEPENDENT_AMBULATORY_CARE_PROVIDER_SITE_OTHER): Payer: Medicare Other

## 2022-06-03 VITALS — Ht 71.0 in | Wt 166.0 lb

## 2022-06-03 DIAGNOSIS — Z0000 Encounter for general adult medical examination without abnormal findings: Secondary | ICD-10-CM

## 2022-06-03 NOTE — Patient Instructions (Signed)
Vincent Howell , Thank you for taking time to come for your Medicare Wellness Visit. I appreciate your ongoing commitment to your health goals. Please review the following plan we discussed and let me know if I can assist you in the future.   These are the goals we discussed:  Goals      My healthcare goal for 2024 is to maintain my current health status by continuing to eat healthy, stay physically active and socially active.        This is a list of the screening recommended for you and due dates:  Health Maintenance  Topic Date Due   Complete foot exam   Never done   Eye exam for diabetics  Never done   DTaP/Tdap/Td vaccine (1 - Tdap) Never done   Zoster (Shingles) Vaccine (1 of 2) Never done   COVID-19 Vaccine (3 - Pfizer risk series) 07/10/2019   Pneumonia Vaccine (1 - PCV) 02/18/2023*   Hemoglobin A1C  09/23/2022   Medicare Annual Wellness Visit  06/04/2023   Flu Shot  Completed   HPV Vaccine  Aged Out  *Topic was postponed. The date shown is not the original due date.    Advanced directives: Yes  Conditions/risks identified: Yes  Next appointment: Follow up in one year for your annual wellness visit.   Preventive Care 87 Years and Older, Male  Preventive care refers to lifestyle choices and visits with your health care provider that can promote health and wellness. What does preventive care include? A yearly physical exam. This is also called an annual well check. Dental exams once or twice a year. Routine eye exams. Ask your health care provider how often you should have your eyes checked. Personal lifestyle choices, including: Daily care of your teeth and gums. Regular physical activity. Eating a healthy diet. Avoiding tobacco and drug use. Limiting alcohol use. Practicing safe sex. Taking low doses of aspirin every day. Taking vitamin and mineral supplements as recommended by your health care provider. What happens during an annual well check? The services and  screenings done by your health care provider during your annual well check will depend on your age, overall health, lifestyle risk factors, and family history of disease. Counseling  Your health care provider may ask you questions about your: Alcohol use. Tobacco use. Drug use. Emotional well-being. Home and relationship well-being. Sexual activity. Eating habits. History of falls. Memory and ability to understand (cognition). Work and work Statistician. Screening  You may have the following tests or measurements: Height, weight, and BMI. Blood pressure. Lipid and cholesterol levels. These may be checked every 5 years, or more frequently if you are over 3 years old. Skin check. Lung cancer screening. You may have this screening every year starting at age 46 if you have a 30-pack-year history of smoking and currently smoke or have quit within the past 15 years. Fecal occult blood test (FOBT) of the stool. You may have this test every year starting at age 56. Flexible sigmoidoscopy or colonoscopy. You may have a sigmoidoscopy every 5 years or a colonoscopy every 10 years starting at age 89. Prostate cancer screening. Recommendations will vary depending on your family history and other risks. Hepatitis C blood test. Hepatitis B blood test. Sexually transmitted disease (STD) testing. Diabetes screening. This is done by checking your blood sugar (glucose) after you have not eaten for a while (fasting). You may have this done every 1-3 years. Abdominal aortic aneurysm (AAA) screening. You may need this if  you are a current or former smoker. Osteoporosis. You may be screened starting at age 34 if you are at high risk. Talk with your health care provider about your test results, treatment options, and if necessary, the need for more tests. Vaccines  Your health care provider may recommend certain vaccines, such as: Influenza vaccine. This is recommended every year. Tetanus, diphtheria, and  acellular pertussis (Tdap, Td) vaccine. You may need a Td booster every 10 years. Zoster vaccine. You may need this after age 62. Pneumococcal 13-valent conjugate (PCV13) vaccine. One dose is recommended after age 65. Pneumococcal polysaccharide (PPSV23) vaccine. One dose is recommended after age 3. Talk to your health care provider about which screenings and vaccines you need and how often you need them. This information is not intended to replace advice given to you by your health care provider. Make sure you discuss any questions you have with your health care provider. Document Released: 05/24/2015 Document Revised: 01/15/2016 Document Reviewed: 02/26/2015 Elsevier Interactive Patient Education  2017 [employer] Prevention in the Home Falls can cause injuries. They can happen to people of all ages. There are many things you can do to make your home safe and to help prevent falls. What can I do on the outside of my home? Regularly fix the edges of walkways and driveways and fix any cracks. Remove anything that might make you trip as you walk through a door, such as a raised step or threshold. Trim any bushes or trees on the path to your home. Use bright outdoor lighting. Clear any walking paths of anything that might make someone trip, such as rocks or tools. Regularly check to see if handrails are loose or broken. Make sure that both sides of any steps have handrails. Any raised decks and porches should have guardrails on the edges. Have any leaves, snow, or ice cleared regularly. Use sand or salt on walking paths during winter. Clean up any spills in your garage right away. This includes oil or grease spills. What can I do in the bathroom? Use night lights. Install grab bars by the toilet and in the tub and shower. Do not use towel bars as grab bars. Use non-skid mats or decals in the tub or shower. If you need to sit down in the shower, use a plastic, non-slip stool. Keep  the floor dry. Clean up any water that spills on the floor as soon as it happens. Remove soap buildup in the tub or shower regularly. Attach bath mats securely with double-sided non-slip rug tape. Do not have throw rugs and other things on the floor that can make you trip. What can I do in the bedroom? Use night lights. Make sure that you have a light by your bed that is easy to reach. Do not use any sheets or blankets that are too big for your bed. They should not hang down onto the floor. Have a firm chair that has side arms. You can use this for support while you get dressed. Do not have throw rugs and other things on the floor that can make you trip. What can I do in the kitchen? Clean up any spills right away. Avoid walking on wet floors. Keep items that you use a lot in easy-to-reach places. If you need to reach something above you, use a strong step stool that has a grab bar. Keep electrical cords out of the way. Do not use floor polish or wax that makes floors slippery. If  you must use wax, use non-skid floor wax. Do not have throw rugs and other things on the floor that can make you trip. What can I do with my stairs? Do not leave any items on the stairs. Make sure that there are handrails on both sides of the stairs and use them. Fix handrails that are broken or loose. Make sure that handrails are as long as the stairways. Check any carpeting to make sure that it is firmly attached to the stairs. Fix any carpet that is loose or worn. Avoid having throw rugs at the top or bottom of the stairs. If you do have throw rugs, attach them to the floor with carpet tape. Make sure that you have a light switch at the top of the stairs and the bottom of the stairs. If you do not have them, ask someone to add them for you. What else can I do to help prevent falls? Wear shoes that: Do not have high heels. Have rubber bottoms. Are comfortable and fit you well. Are closed at the toe. Do not  wear sandals. If you use a stepladder: Make sure that it is fully opened. Do not climb a closed stepladder. Make sure that both sides of the stepladder are locked into place. Ask someone to hold it for you, if possible. Clearly mark and make sure that you can see: Any grab bars or handrails. First and last steps. Where the edge of each step is. Use tools that help you move around (mobility aids) if they are needed. These include: Canes. Walkers. Scooters. Crutches. Turn on the lights when you go into a dark area. Replace any light bulbs as soon as they burn out. Set up your furniture so you have a clear path. Avoid moving your furniture around. If any of your floors are uneven, fix them. If there are any pets around you, be aware of where they are. Review your medicines with your doctor. Some medicines can make you feel dizzy. This can increase your chance of falling. Ask your doctor what other things that you can do to help prevent falls. This information is not intended to replace advice given to you by your health care provider. Make sure you discuss any questions you have with your health care provider. Document Released: 02/21/2009 Document Revised: 10/03/2015 Document Reviewed: 06/01/2014 Elsevier Interactive Patient Education  2017 [employer].

## 2022-06-03 NOTE — Progress Notes (Signed)
Virtual Visit via Telephone Note  I connected with  Vincent Howell on 06/03/22 at 11:15 AM EST by telephone and verified that I am speaking with the correct person using two identifiers.  Location: Patient: Home Provider: [place] Persons participating in the virtual visit: [place]   I discussed the limitations, risks, security and privacy concerns of performing an evaluation and management service by telephone and the availability of in person appointments. The patient expressed understanding and agreed to proceed.  Interactive audio and video telecommunications were attempted between this nurse and patient, however failed, due to patient having technical difficulties OR patient did not have access to video capability.  We continued and completed visit with audio only.  Some vital signs may be absent or patient reported.   Sheral Flow, LPN  Subjective:   Vincent Howell is a 89 y.o. male who presents for an Initial Medicare Annual Wellness Visit.  Review of Systems     Cardiac Risk Factors include: advanced age (>18mn, >>21women);dyslipidemia;hypertension;male gender     Objective:    Today's Vitals   06/03/22 1130  Weight: 166 lb (75.3 kg)  Height: '5\' 11"'$  (1.803 m)  PainSc: 0-No pain   Body mass index is 23.15 kg/m.     06/03/2022   11:34 AM 08/08/2021   12:00 PM 08/05/2021    5:04 PM 06/29/2021    9:01 AM  Advanced Directives  Does Patient Have a Medical Advance Directive? Yes Yes No No  Type of AParamed[place]of [place] will [place] will    Does patient want to make changes to medical advance directive?  No - Patient declined    Copy of [place] Chart? No - copy requested No - copy requested    Would patient like information on creating a medical advance directive?    No - Patient declined    Current Medications (verified) Outpatient Encounter  Medications as of 06/03/2022  Medication Sig   amiodarone (PACERONE) 200 MG tablet Take 0.5 tablets (100 mg total) by mouth daily.   apixaban (ELIQUIS) 5 MG TABS tablet Take 1 tablet (5 mg total) by mouth 2 (two) times daily.   atorvastatin (LIPITOR) 20 MG tablet Take 1 tablet (20 mg total) by mouth daily.   Cholecalciferol (VITAMIN D) 50 MCG (2000 UT) CAPS Take 1 capsule by mouth daily.   ezetimibe (ZETIA) 10 MG tablet Take 1 tablet (10 mg total) by mouth daily.   fluticasone (VERAMYST) 27.5 MCG/SPRAY nasal spray Place 2 sprays into the nose daily.   glimepiride (AMARYL) 2 MG tablet Take 2 mg by mouth daily with breakfast.   losartan (COZAAR) 50 MG tablet Take 1 tablet (50 mg total) by mouth daily.   metFORMIN (GLUCOPHAGE-XR) 500 MG 24 hr tablet Take 3 tablets (1,500 mg total) by mouth daily. Resume on 08/10/21.   Multiple Vitamins-Minerals (ICAPS AREDS 2 PO) Take 2 tablets by mouth daily.   nitroGLYCERIN (NITROSTAT) 0.4 MG SL tablet Place 1 tablet (0.4 mg total) under the tongue every 5 (five) minutes x 3 doses as needed for chest pain.   pyridOXINE (VITAMIN B-6) 100 MG tablet Take 100 mg by mouth daily.   vitamin B-12 (CYANOCOBALAMIN) 500 MCG tablet Take 500 mcg by mouth daily.   No facility-administered encounter medications on file as of 06/03/2022.    Allergies (verified) Aspirin; Penicillins; Acetaminophen; 2,4-d dimethylamine; Benzoin; and Other   History: Past Medical History:  Diagnosis  Date   CAD (coronary artery disease)    Diabetes mellitus without complication ([place])    Hypercholesteremia    Hypertension    Macular degeneration    Pancreatitis    Prostate cancer ([place])    Past Surgical History:  Procedure Laterality Date   APPENDECTOMY     BRAIN SURGERY     CHOLECYSTECTOMY     KNEE SURGERY     PROSTATECTOMY     RIGHT/LEFT HEART CATH AND CORONARY ANGIOGRAPHY N/A 08/07/2021   Procedure: RIGHT/LEFT HEART CATH AND CORONARY ANGIOGRAPHY;  Surgeon: Early Osmond, MD;   Location: [facility];  Service: Cardiovascular;  Laterality: N/A;   TONSILLECTOMY     TUMOR REMOVAL Right    KNEE   Family History  Problem Relation Age of Onset   Heart attack Brother    Social History   Socioeconomic History   Marital status: Married    Spouse name: Not on file   Number of children: 2   Years of education: Not on file   Highest education level: Not on file  Occupational History   Not on file  Tobacco Use   Smoking status: Former    Types: Cigarettes   Smokeless tobacco: Never  Vaping Use   Vaping Use: Never used  Substance and Sexual Activity   Alcohol use: Yes    Comment: Occasional   Drug use: Never   Sexual activity: Not on file  Other Topics Concern   Not on file  Social History Narrative   Not on file   Social Determinants of Health   Financial Resource Strain: Low Risk  (06/03/2022)   Overall Financial Resource Strain (CARDIA)    Difficulty of Paying Living Expenses: Not hard at all  Food Insecurity: No Food Insecurity (06/03/2022)   Hunger Vital Sign    Worried About Running Out of Food in the Last Year: Never true    [place] in the Last Year: Never true  Transportation Needs: No Transportation Needs (06/03/2022)   PRAPARE - Hydrologist (Medical): No    Lack of Transportation (Non-Medical): No  Physical Activity: Sufficiently Active (06/03/2022)   Exercise Vital Sign    Days of Exercise per Week: 5 days    Minutes of Exercise per Session: 40 min  Stress: No Stress Concern Present (06/03/2022)   [place]    Feeling of Stress : Only a little  Social Connections: Unknown (06/03/2022)   Social Connection and Isolation Panel [NHANES]    Frequency of Communication with Friends and Family: Once a week    Frequency of Social Gatherings with Friends and Family: Not on file    Attends Religious Services: Not on Occupational hygienist or Organizations: No    Attends Archivist Meetings: Patient refused    Marital Status: Married    Tobacco Counseling Counseling given: Not Answered   Clinical Intake:  Pre-visit preparation completed: Yes  Pain : No/denies pain Pain Score: 0-No pain     BMI - recorded: 23.15 Nutritional Status: BMI of 19-24  Normal Nutritional Risks: None Diabetes: No  How often do you need to have someone help you when you read instructions, pamphlets, or other written materials from your doctor or pharmacy?: 1 - Never What is the last grade level you completed in school?: Associates Degree  Diabetic? Controlled by oral medication, diet & exercise  Interpreter Needed?: No  Information entered by :: Lisette Abu, LPN.   Activities of Daily Living    06/03/2022   10:22 AM 05/30/2022    9:19 AM  In your present state of health, do you have any difficulty performing the following activities:  Hearing? 1 1  Vision? 0 0  Difficulty concentrating or making decisions? 0 0  Walking or climbing stairs? 0 0  Dressing or bathing? 0 0  Doing errands, shopping? 0 0  Preparing Food and eating ? N N  Using the Toilet? N N  In the past six months, have you accidently leaked urine? N N  Do you have problems with loss of bowel control? N N  Managing your Medications? N N  Managing your Finances? N N  Housekeeping or managing your Housekeeping? N N    Patient Care Team: Hoyt Koch, MD as PCP - General (Internal Medicine) Stanford Breed Denice Bors, MD as PCP - Cardiology (Cardiology) Alanda Slim Neena Rhymes, MD as Consulting Physician (Ophthalmology)  Indicate any recent Medical Services you may have received from other than [facility] providers in the past year (date may be approximate).     Assessment:   This is a routine wellness examination for [place].  Hearing/Vision screen Hearing Screening - Comments:: Patient wears hearing aids. Vision Screening - Comments:: Wears  rx glasses - up to date with routine eye exams with Julian Reil, MD.   Dietary issues and exercise activities discussed: Current Exercise Habits: Home exercise routine, Type of exercise: treadmill, Time (Minutes): 40, Frequency (Times/Week): 5, Weekly Exercise (Minutes/Week): 200, Intensity: Moderate, Exercise limited by: cardiac condition(s)   Goals Addressed             This Visit's Progress    My healthcare goal for 2024 is to maintain my current health status by continuing to eat healthy, stay physically active and socially active.        Depression Screen    06/03/2022   11:32 AM 02/17/2022   10:53 AM  PHQ 2/9 Scores  PHQ - 2 Score 0 0  PHQ- 9 Score  0    Fall Risk    06/03/2022   11:36 AM 05/30/2022    9:19 AM 02/17/2022   10:52 AM  Fall Risk   Falls in the past year? 1 1 0  Number falls in past yr: 1 1 0  Injury with Fall? 0 0 0  Risk for fall due to : History of fall(s)  No Fall Risks  Follow up Falls evaluation completed  Falls evaluation completed    [place]:  Any stairs in or around the home? Yes  If so, are there any without handrails? No  Home free of loose throw rugs in walkways, pet beds, electrical cords, etc? Yes  Adequate lighting in your home to reduce risk of falls? Yes   ASSISTIVE DEVICES UTILIZED TO PREVENT FALLS:  Life alert? No  Use of a cane, walker or w/c? No  Grab bars in the bathroom? No  Shower chair or bench in shower? Yes  Elevated toilet seat or a handicapped toilet? No   TIMED UP AND GO: Phone Visit  Was the test performed? No .   Cognitive Function:        06/03/2022   10:21 AM  6CIT Screen  What Year? 0 points  What month? 0 points  What time? 0 points  Count back from 20 0 points  Months in reverse 0  points  Repeat phrase 0 points  Total Score 0 points    Immunizations Immunization History  Administered Date(s) Administered   Fluad Quad(high Dose 65+) 02/17/2022    Influenza-Unspecified 02/17/2021   PFIZER(Purple Top)SARS-COV-2 Vaccination 05/23/2019, 06/12/2019, 02/05/2020, 12/31/2020, 04/30/2021   Zoster Recombinat (Shingrix) 07/11/2020, 12/10/2020    TDAP status: need records from previous pcp  Flu Vaccine status: Up to date  Pneumococcal vaccine status: Due, Education has been provided regarding the importance of this vaccine. Advised may receive this vaccine at local pharmacy or Health Dept. Aware to provide a copy of the vaccination record if obtained from local pharmacy or Health Dept. Verbalized acceptance and understanding.  Covid-19 vaccine status: Completed vaccines  Qualifies for Shingles Vaccine? Yes   Zostavax completed No   Shingrix Completed?: Yes  Screening Tests Health Maintenance  Topic Date Due   FOOT EXAM  Never done   OPHTHALMOLOGY EXAM  Never done   DTaP/Tdap/Td (1 - Tdap) Never done   COVID-19 Vaccine (6 - 2023-24 season) 01/09/2022   Pneumonia Vaccine 63+ Years old (1 - PCV) 02/18/2023 (Originally 11/09/1938)   HEMOGLOBIN A1C  09/23/2022   Medicare Annual Wellness (AWV)  06/04/2023   INFLUENZA VACCINE  Completed   Zoster Vaccines- Shingrix  Completed   HPV VACCINES  Aged Out    Health Maintenance  Health Maintenance Due  Topic Date Due   FOOT EXAM  Never done   OPHTHALMOLOGY EXAM  Never done   DTaP/Tdap/Td (1 - Tdap) Never done   COVID-19 Vaccine (6 - 2023-24 season) 01/09/2022    Colorectal cancer screening: No longer required.   Lung Cancer Screening: (Low Dose CT Chest recommended if Age 79-80 years, 30 pack-year currently smoking OR have quit w/in 15years.) does not qualify.   Lung Cancer Screening Referral: no  Additional Screening:  Hepatitis C Screening: does not qualify; Completed no  Vision Screening: Recommended annual ophthalmology exams for early detection of glaucoma and other disorders of the eye. Is the patient up to date with their annual eye exam?  Yes  Who is the provider or what is  the name of the office in which the patient attends annual eye exams? Julian Reil, MD. If pt is not established with a provider, would they like to be referred to a provider to establish care? No .   Dental Screening: Recommended annual dental exams for proper oral hygiene  Community Resource Referral / Chronic Care Management: CRR required this visit?  No   CCM required this visit?  No      Plan:     I have personally reviewed and noted the following in the patient's chart:   Medical and social history Use of alcohol, tobacco or illicit drugs  Current medications and supplements including opioid prescriptions. Patient is not currently taking opioid prescriptions. Functional ability and status Nutritional status Physical activity Advanced directives List of other physicians Hospitalizations, surgeries, and ER visits in previous 12 months Vitals Screenings to include cognitive, depression, and falls Referrals and appointments  In addition, I have reviewed and discussed with patient certain preventive protocols, quality metrics, and best practice recommendations. A written personalized care plan for preventive services as well as general preventive health recommendations were provided to patient.     Sheral Flow, LPN   [id number]   Nurse Notes: N/A

## 2022-06-22 ENCOUNTER — Other Ambulatory Visit: Payer: Self-pay | Admitting: Cardiology

## 2022-06-22 ENCOUNTER — Encounter: Payer: Self-pay | Admitting: Internal Medicine

## 2022-06-22 MED ORDER — GLIMEPIRIDE 2 MG PO TABS: 2.0000 mg | ORAL_TABLET | Freq: Every day | ORAL | 2 refills | Status: DC

## 2022-06-29 NOTE — Progress Notes (Signed)
HPI: FU atrial fibrillation, syncope and FU coronary artery disease.  Patient apparently had angioplasty of unknown vessel at [facility] in the 1980s.  Patient seen in the office March 2023 following syncopal episode.  He was admitted. Cardiac catheterization revealed 90% mid LAD, 60% proximal LAD and 80% third posterior lateral.  Given that patient was not having chest pain he has been treated medically.  During that admission the patient developed atrial fibrillation with rapid ventricular response.  He was treated with IV amiodarone and converted to sinus rhythm.  He was discharged with an outpatient monitor which was performed March 2023 and showed sinus rhythm with PACs, PAT, paroxysmal atrial fibrillation and 7 beats nonsustained ventricular tachycardia.  Echocardiogram September 2023 showed ejection fraction 50 to 55%, mild left ventricular enlargement, mild left ventricular hypertrophy, moderate left atrial enlargement, mild mitral regurgitation, mild aortic insufficiency.  Since last seen he denies increased dyspnea on exertion, orthopnea, PND, pedal edema, exertional chest pain or syncope.  No palpitations.  Current Outpatient Medications  Medication Sig Dispense Refill   amiodarone (PACERONE) 200 MG tablet Take 0.5 tablets (100 mg total) by mouth daily. 45 tablet 2   apixaban (ELIQUIS) 5 MG TABS tablet Take 1 tablet (5 mg total) by mouth 2 (two) times daily. 180 tablet 3   atorvastatin (LIPITOR) 20 MG tablet Take 1 tablet (20 mg total) by mouth daily. 90 tablet 3   Cholecalciferol (VITAMIN D) 50 MCG (2000 UT) CAPS Take 1 capsule by mouth daily.     ezetimibe (ZETIA) 10 MG tablet Take 1 tablet (10 mg total) by mouth daily. 90 tablet 3   fluticasone (VERAMYST) 27.5 MCG/SPRAY nasal spray Place 2 sprays into the nose daily.     glimepiride (AMARYL) 2 MG tablet Take 1 tablet (2 mg total) by mouth daily with breakfast. 90 tablet 2   losartan (COZAAR) 50 MG tablet TAKE 1 TABLET BY MOUTH EVERY  DAY 90 tablet 0   metFORMIN (GLUCOPHAGE-XR) 500 MG 24 hr tablet Take 3 tablets (1,500 mg total) by mouth daily. Resume on 08/10/21. 270 tablet 3   Multiple Vitamins-Minerals (ICAPS AREDS 2 PO) Take 2 tablets by mouth daily.     nitroGLYCERIN (NITROSTAT) 0.4 MG SL tablet Place 1 tablet (0.4 mg total) under the tongue every 5 (five) minutes x 3 doses as needed for chest pain. 25 tablet 3   pyridOXINE (VITAMIN B-6) 100 MG tablet Take 100 mg by mouth daily.     vitamin B-12 (CYANOCOBALAMIN) 500 MCG tablet Take 500 mcg by mouth daily.     No current facility-administered medications for this visit.     Past Medical History:  Diagnosis Date   CAD (coronary artery disease)    Diabetes mellitus without complication ([place])    Hypercholesteremia    Hypertension    Macular degeneration    Pancreatitis    Prostate cancer ([place])     Past Surgical History:  Procedure Laterality Date   APPENDECTOMY     BRAIN SURGERY     CHOLECYSTECTOMY     KNEE SURGERY     PROSTATECTOMY     RIGHT/LEFT HEART CATH AND CORONARY ANGIOGRAPHY N/A 08/07/2021   Procedure: RIGHT/LEFT HEART CATH AND CORONARY ANGIOGRAPHY;  Surgeon: Early Osmond, MD;  Location: [place] [facility];  Service: Cardiovascular;  Laterality: N/A;   TONSILLECTOMY     TUMOR REMOVAL Right    KNEE    Social History   Socioeconomic History   Marital status: Married  Spouse name: Not on file   Number of children: 2   Years of education: Not on file   Highest education level: Not on file  Occupational History   Not on file  Tobacco Use   Smoking status: Former    Types: Cigarettes   Smokeless tobacco: Never  Vaping Use   Vaping Use: Never used  Substance and Sexual Activity   Alcohol use: Yes    Comment: Occasional   Drug use: Never   Sexual activity: Not on file  Other Topics Concern   Not on file  Social History Narrative   Not on file   Social Determinants of Health   Financial Resource Strain: Low Risk  (06/03/2022)    Overall Financial Resource Strain (CARDIA)    Difficulty of Paying Living Expenses: Not hard at all  Food Insecurity: No Food Insecurity (06/03/2022)   Hunger Vital Sign    Worried About Running Out of Food in the Last Year: Never true    [place] in the Last Year: Never true  Transportation Needs: No Transportation Needs (06/03/2022)   PRAPARE - Hydrologist (Medical): No    Lack of Transportation (Non-Medical): No  Physical Activity: Sufficiently Active (06/03/2022)   Exercise Vital Sign    Days of Exercise per Week: 5 days    Minutes of Exercise per Session: 40 min  Stress: No Stress Concern Present (06/03/2022)   [place]    Feeling of Stress : Only a little  Social Connections: Unknown (06/03/2022)   Social Connection and Isolation Panel [NHANES]    Frequency of Communication with Friends and Family: Once a week    Frequency of Social Gatherings with Friends and Family: Not on file    Attends Religious Services: Not on file    Active Member of Clubs or Organizations: No    Attends Archivist Meetings: Patient refused    Marital Status: Married  Human resources officer Violence: Not At Risk (06/03/2022)   Humiliation, Afraid, Rape, and Kick questionnaire    Fear of Current or Ex-Partner: No    Emotionally Abused: No    Physically Abused: No    Sexually Abused: No    Family History  Problem Relation Age of Onset   Heart attack Brother     ROS: no fevers or chills, productive cough, hemoptysis, dysphasia, odynophagia, melena, hematochezia, dysuria, hematuria, rash, seizure activity, orthopnea, PND, pedal edema, claudication. Remaining systems are negative.  Physical Exam: Well-developed well-nourished in no acute distress.  Skin is warm and dry.  HEENT is normal.  Neck is supple.  Chest is clear to auscultation with normal expansion.  Cardiovascular exam is regular rate  and rhythm.  Abdominal exam nontender or distended. No masses palpated. Extremities show no edema. neuro grossly intact A/P  1 paroxysmal atrial fibrillation-patient remains in sinus rhythm.  Continue apixaban and amiodarone.  Check CBC, renal function, liver functions, TSH, free T4 and chest x-ray.  2 coronary artery disease-continue statin.  No aspirin given need for apixaban.  He is not complaining of chest pain.  3 cardiomyopathy-mixed ischemic/nonischemic.  Continue ARB.  We have avoided beta-blockade due to baseline bradycardia.  LV function improved on most recent echo.  4 hypertension-patient's blood pressure is controlled.  Continue present medical regimen.  5 hyperlipidemia-continue statin.  Check lipids and liver.  Check homocystine level.    Kirk Ruths, MD

## 2022-07-13 ENCOUNTER — Encounter: Payer: Self-pay | Admitting: Cardiology

## 2022-07-13 ENCOUNTER — Ambulatory Visit: Payer: Medicare Other | Attending: Cardiology | Admitting: Cardiology

## 2022-07-13 VITALS — BP 138/62 | HR 56 | Ht 70.0 in | Wt 170.0 lb

## 2022-07-13 DIAGNOSIS — I2510 Atherosclerotic heart disease of native coronary artery without angina pectoris: Secondary | ICD-10-CM | POA: Insufficient documentation

## 2022-07-13 DIAGNOSIS — I10 Essential (primary) hypertension: Secondary | ICD-10-CM | POA: Insufficient documentation

## 2022-07-13 DIAGNOSIS — E7800 Pure hypercholesterolemia, unspecified: Secondary | ICD-10-CM | POA: Insufficient documentation

## 2022-07-13 DIAGNOSIS — I480 Paroxysmal atrial fibrillation: Secondary | ICD-10-CM | POA: Diagnosis present

## 2022-07-13 NOTE — Patient Instructions (Signed)
  Testing/Procedures:  A chest x-ray takes a picture of the organs and structures inside the chest, including the heart, lungs, and blood vessels. This test can show several things, including, whether the heart is enlarges; whether fluid is building up in the lungs; and whether pacemaker / defibrillator leads are still in place. [facility]     Follow-Up: At [facility], you and your health needs are our priority.  As part of our continuing mission to provide you with exceptional heart care, we have created designated Provider Care Teams.  These Care Teams include your primary Cardiologist (physician) and Advanced Practice Providers (APPs -  Physician Assistants and Nurse Practitioners) who all work together to provide you with the care you need, when you need it.  We recommend signing up for the patient portal called "MyChart".  Sign up information is provided on this After Visit Summary.  MyChart is used to connect with patients for Virtual Visits (Telemedicine).  Patients are able to view lab/test results, encounter notes, upcoming appointments, etc.  Non-urgent messages can be sent to your provider as well.   To learn more about what you can do with MyChart, go to [email or web address].    Your next appointment:   12 month(s)  Provider:   Kirk Ruths, MD

## 2022-07-14 LAB — COMPREHENSIVE METABOLIC PANEL
ALT: 23 IU/L (ref 0–44)
AST: 24 IU/L (ref 0–40)
Albumin/Globulin Ratio: 1.9 (ref 1.2–2.2)
Albumin: 4.5 g/dL (ref 3.7–4.7)
Alkaline Phosphatase: 114 IU/L (ref 44–121)
BUN/Creatinine Ratio: 23 (ref 10–24)
BUN: 20 mg/dL (ref 8–27)
Bilirubin Total: 0.6 mg/dL (ref 0.0–1.2)
CO2: 24 mmol/L (ref 20–29)
Calcium: 9.4 mg/dL (ref 8.6–10.2)
Chloride: 105 mmol/L (ref 96–106)
Creatinine, Ser: 0.87 mg/dL (ref 0.76–1.27)
Globulin, Total: 2.4 g/dL (ref 1.5–4.5)
Glucose: 68 mg/dL — ABNORMAL LOW (ref 70–99)
Potassium: 4.6 mmol/L (ref 3.5–5.2)
Sodium: 143 mmol/L (ref 134–144)
Total Protein: 6.9 g/dL (ref 6.0–8.5)
eGFR: 84 mL/min/{1.73_m2} (ref >59)

## 2022-07-14 LAB — CBC
Hematocrit: 39.2 % (ref 37.5–51.0)
Hemoglobin: 12.8 g/dL — ABNORMAL LOW (ref 13.0–17.7)
MCH: 30 pg (ref 26.6–33.0)
MCHC: 32.7 g/dL (ref 31.5–35.7)
MCV: 92 fL (ref 79–97)
Platelets: 218 10*3/uL (ref 150–450)
RBC: 4.27 x10E6/uL (ref 4.14–5.80)
RDW: 13.1 % (ref 11.6–15.4)
WBC: 7 10*3/uL (ref 3.4–10.8)

## 2022-07-14 LAB — LIPID PANEL
Chol/HDL Ratio: 2.2 ratio (ref 0.0–5.0)
Cholesterol, Total: 129 mg/dL (ref 100–199)
HDL: 58 mg/dL (ref >39)
LDL Chol Calc (NIH): 55 mg/dL (ref 0–99)
Triglycerides: 84 mg/dL (ref 0–149)
VLDL Cholesterol Cal: 16 mg/dL (ref 5–40)

## 2022-07-14 LAB — HOMOCYSTEINE: Homocysteine: 12.7 umol/L (ref 0.0–21.3)

## 2022-07-14 LAB — TSH+FREE T4
Free T4: 1.21 ng/dL (ref 0.82–1.77)
TSH: 8.13 u[IU]/mL — ABNORMAL HIGH (ref 0.450–4.500)

## 2022-07-15 ENCOUNTER — Encounter: Payer: Self-pay | Admitting: Internal Medicine

## 2022-07-16 ENCOUNTER — Ambulatory Visit
Admission: RE | Admit: 2022-07-16 | Discharge: 2022-07-16 | Disposition: A | Discharge status: Home / Self Care | Payer: Medicare Other | Source: Ambulatory Visit | Attending: Cardiology | Admitting: Cardiology

## 2022-07-16 DIAGNOSIS — I480 Paroxysmal atrial fibrillation: Secondary | ICD-10-CM

## 2022-07-29 LAB — HM DIABETES EYE EXAM

## 2022-08-04 ENCOUNTER — Other Ambulatory Visit: Payer: Self-pay

## 2022-08-04 MED ORDER — APIXABAN 5 MG PO TABS: 5.0000 mg | ORAL_TABLET | Freq: Two times a day (BID) | ORAL | 6 refills | Status: DC

## 2022-08-04 NOTE — Telephone Encounter (Signed)
Received faxed refill request for Vincent Howell from [facility].  Pt last saw Dr Stanford Breed 07/13/22, last labs 07/13/22 Creat 0.87, age 89, weight 77.1kg, based on specified criteria pt is on appropriate dosage of Vincent Howell 5mg  BID for afib.  Will refill rx.

## 2022-08-19 ENCOUNTER — Ambulatory Visit (INDEPENDENT_AMBULATORY_CARE_PROVIDER_SITE_OTHER): Payer: Medicare Other | Admitting: Internal Medicine

## 2022-08-19 ENCOUNTER — Encounter: Payer: Self-pay | Admitting: Internal Medicine

## 2022-08-19 VITALS — BP 132/80 | HR 93 | Temp 97.6°F | Ht 70.0 in | Wt 172.0 lb

## 2022-08-19 DIAGNOSIS — E118 Type 2 diabetes mellitus with unspecified complications: Secondary | ICD-10-CM | POA: Diagnosis not present

## 2022-08-19 LAB — COMPREHENSIVE METABOLIC PANEL
ALT: 22 U/L (ref 0–53)
AST: 25 U/L (ref 0–37)
Albumin: 4.4 g/dL (ref 3.5–5.2)
Alkaline Phosphatase: 96 U/L (ref 39–117)
BUN: 18 mg/dL (ref 6–23)
CO2: 28 mEq/L (ref 19–32)
Calcium: 9.6 mg/dL (ref 8.4–10.5)
Chloride: 105 mEq/L (ref 96–112)
Creatinine, Ser: 0.99 mg/dL (ref 0.40–1.50)
GFR: 68.37 mL/min (ref >60.00)
Glucose, Bld: 112 mg/dL — ABNORMAL HIGH (ref 70–99)
Potassium: 4.9 mEq/L (ref 3.5–5.1)
Sodium: 141 mEq/L (ref 135–145)
Total Bilirubin: 0.5 mg/dL (ref 0.2–1.2)
Total Protein: 7.4 g/dL (ref 6.0–8.3)

## 2022-08-19 LAB — HEMOGLOBIN A1C: Hgb A1c MFr Bld: 6.4 % (ref 4.6–6.5)

## 2022-08-19 NOTE — Progress Notes (Signed)
   Subjective:   Patient ID: Vincent Howell, male    DOB: 1932/11/26, 89 y.o.   MRN: [id number]  HPI The patient is an 89 YO man coming in for follow up.  Review of Systems  Constitutional: Negative.   HENT: Negative.    Eyes: Negative.   Respiratory:  Negative for cough, chest tightness and shortness of breath.   Cardiovascular:  Negative for chest pain, palpitations and leg swelling.  Gastrointestinal:  Negative for abdominal distention, abdominal pain, constipation, diarrhea, nausea and vomiting.  Musculoskeletal:  Positive for arthralgias.  Skin: Negative.   Neurological: Negative.   Psychiatric/Behavioral: Negative.      Objective:  Physical Exam Constitutional:      Appearance: He is well-developed.  HENT:     Head: Normocephalic and atraumatic.  Cardiovascular:     Rate and Rhythm: Normal rate and regular rhythm.  Pulmonary:     Effort: Pulmonary effort is normal. No respiratory distress.     Breath sounds: Normal breath sounds. No wheezing or rales.  Abdominal:     General: Bowel sounds are normal. There is no distension.     Palpations: Abdomen is soft.     Tenderness: There is no abdominal tenderness. There is no rebound.  Musculoskeletal:     Cervical back: Normal range of motion.  Skin:    General: Skin is warm and dry.  Neurological:     Mental Status: He is alert and oriented to person, place, and time.     Coordination: Coordination normal.     Vitals:   08/19/22 1036  BP: 132/80  Pulse: 93  Temp: 97.6 F (36.4 C)  TempSrc: Oral  SpO2: 98%  Weight: 172 lb (78 kg)  Height:  (1.778 m)    Assessment & Plan:

## 2022-08-20 ENCOUNTER — Encounter: Payer: Self-pay | Admitting: Internal Medicine

## 2022-08-21 NOTE — Assessment & Plan Note (Signed)
Checking CMP and HgA1c. Adjust as needed. He is taking amaryl 2 mg daily and metformin 1500 mg daily. Previously at goal. Taking statin and ARB. Reminded about eye exam and foot exam up to date.

## 2022-09-22 ENCOUNTER — Other Ambulatory Visit: Payer: Self-pay | Admitting: Cardiology

## 2022-11-30 ENCOUNTER — Encounter: Payer: Self-pay | Admitting: Cardiology

## 2022-12-19 ENCOUNTER — Other Ambulatory Visit: Payer: Self-pay | Admitting: Cardiology

## 2023-01-02 ENCOUNTER — Other Ambulatory Visit: Payer: Self-pay | Admitting: Cardiology

## 2023-01-02 DIAGNOSIS — E7800 Pure hypercholesterolemia, unspecified: Secondary | ICD-10-CM

## 2023-01-05 NOTE — Progress Notes (Signed)
HPI: FU atrial fibrillation, syncope and FU coronary artery disease.  Patient apparently had angioplasty of unknown vessel at [facility] in the 1980s.  Patient admitted from the office March 2023 following syncopal episode.  Cardiac catheterization revealed 90% mid LAD, 60% proximal LAD and 80% third posterior lateral. Given that patient was not having chest pain he has been treated medically.  During that admission the patient developed atrial fibrillation with rapid ventricular response.  He was treated with IV amiodarone and converted to sinus rhythm.  He was discharged with an outpatient monitor which was performed March 2023 and showed sinus rhythm with PACs, PAT, paroxysmal atrial fibrillation and 7 beats nonsustained ventricular tachycardia.  Echocardiogram September 2023 showed ejection fraction 50 to 55%, mild left ventricular enlargement, mild left ventricular hypertrophy, moderate left atrial enlargement, mild mitral regurgitation, mild aortic insufficiency.  Since last seen he denies dyspnea, chest pain, palpitations or syncope.  Current Outpatient Medications  Medication Sig Dispense Refill   amiodarone (PACERONE) 200 MG tablet TAKE 1/2 TABLET BY MOUTH DAILY 45 tablet 2   apixaban (ELIQUIS) 5 MG TABS tablet Take 1 tablet (5 mg total) by mouth 2 (two) times daily. 60 tablet 6   atorvastatin (LIPITOR) 20 MG tablet Take 1 tablet (20 mg total) by mouth daily. 90 tablet 3   Cholecalciferol (VITAMIN D) 50 MCG (2000 UT) CAPS Take 1 capsule by mouth daily.     ezetimibe (ZETIA) 10 MG tablet TAKE 1 TABLET BY MOUTH EVERY DAY 90 tablet 3   fluticasone (VERAMYST) 27.5 MCG/SPRAY nasal spray Place 2 sprays into the nose daily.     glimepiride (AMARYL) 2 MG tablet Take 1 tablet (2 mg total) by mouth daily with breakfast. 90 tablet 2   losartan (COZAAR) 50 MG tablet TAKE 1 TABLET BY MOUTH EVERY DAY 90 tablet 3   metFORMIN (GLUCOPHAGE-XR) 500 MG 24 hr tablet Take 3 tablets (1,500 mg total) by mouth  daily. Resume on 08/10/21. 270 tablet 3   Multiple Vitamins-Minerals (ICAPS AREDS 2 PO) Take 2 tablets by mouth daily.     nitroGLYCERIN (NITROSTAT) 0.4 MG SL tablet Place 1 tablet (0.4 mg total) under the tongue every 5 (five) minutes x 3 doses as needed for chest pain. 25 tablet 3   pyridOXINE (VITAMIN B-6) 100 MG tablet Take 100 mg by mouth daily.     vitamin B-12 (CYANOCOBALAMIN) 500 MCG tablet Take 500 mcg by mouth daily.     No current facility-administered medications for this visit.     Past Medical History:  Diagnosis Date   CAD (coronary artery disease)    Diabetes mellitus without complication ([facility])    Hypercholesteremia    Hypertension    Macular degeneration    Pancreatitis    Prostate cancer ([facility])     Past Surgical History:  Procedure Laterality Date   APPENDECTOMY     BRAIN SURGERY     CHOLECYSTECTOMY     KNEE SURGERY     PROSTATECTOMY     RIGHT/LEFT HEART CATH AND CORONARY ANGIOGRAPHY N/A 08/07/2021   Procedure: RIGHT/LEFT HEART CATH AND CORONARY ANGIOGRAPHY;  Surgeon: Orbie Pyo, MD;  Location: [facility];  Service: Cardiovascular;  Laterality: N/A;   TONSILLECTOMY     TUMOR REMOVAL Right    KNEE    Social History   Socioeconomic History   Marital status: Married    Spouse name: Not on file   Number of children: 2   Years of education: Not  on file   Highest education level: Associate degree: occupational, Scientist, product/process development, or vocational program  Occupational History   Not on file  Tobacco Use   Smoking status: Former    Types: Cigarettes   Smokeless tobacco: Never  Vaping Use   Vaping status: Never Used  Substance and Sexual Activity   Alcohol use: Yes    Comment: Occasional   Drug use: Never   Sexual activity: Not on file  Other Topics Concern   Not on file  Social History Narrative   Not on file   Social Determinants of Health   Financial Resource Strain: Low Risk  (08/15/2022)   Overall Financial Resource Strain (CARDIA)     Difficulty of Paying Living Expenses: Not hard at all  Food Insecurity: No Food Insecurity (08/15/2022)   Hunger Vital Sign    Worried About Running Out of Food in the Last Year: Never true    Ran Out of Food in the Last Year: Never true  Transportation Needs: No Transportation Needs (08/15/2022)   PRAPARE - Administrator, Civil Service (Medical): No    Lack of Transportation (Non-Medical): No  Physical Activity: Sufficiently Active (08/15/2022)   Exercise Vital Sign    Days of Exercise per Week: 5 days    Minutes of Exercise per Session: 30 min  Stress: No Stress Concern Present (08/15/2022)   [employer] of Occupational Health - Occupational Stress Questionnaire    Feeling of Stress : Only a little  Social Connections: Unknown (08/15/2022)   Social Connection and Isolation Panel [NHANES]    Frequency of Communication with Friends and Family: Patient declined    Frequency of Social Gatherings with Friends and Family: Patient declined    Attends Religious Services: Patient declined    Database administrator or Organizations: No    Attends Banker Meetings: Patient declined    Marital Status: Married  Catering manager Violence: Not At Risk (06/03/2022)   Humiliation, Afraid, Rape, and Kick questionnaire    Fear of Current or Ex-Partner: No    Emotionally Abused: No    Physically Abused: No    Sexually Abused: No    Family History  Problem Relation Age of Onset   Heart attack Brother     ROS: no fevers or chills, productive cough, hemoptysis, dysphasia, odynophagia, melena, hematochezia, dysuria, hematuria, rash, seizure activity, orthopnea, PND, pedal edema, claudication. Remaining systems are negative.  Physical Exam: Well-developed well-nourished in no acute distress.  Skin is warm and dry.  HEENT is normal.  Neck is supple.  Chest is clear to auscultation with normal expansion.  Cardiovascular exam is regular rate and rhythm.  Abdominal exam  nontender or distended. No masses palpated. Extremities show no edema. neuro grossly intact  EKG Interpretation Date/Time:  Thursday January 14 2023 10:50:53 EDT Ventricular Rate:  44 PR Interval:  194 QRS Duration:  106 QT Interval:  508 QTC Calculation: 434 R Axis:   -10  Text Interpretation: Marked sinus bradycardia When compared with ECG of 07-Aug-2021 08:50, No significant change was found Confirmed by Olga Millers ([id number]) on 01/14/2023 10:53:06 AM    A/P  1 paroxysmal atrial fibrillation-patient is in sinus rhythm today.  Continue amiodarone (he is taking 100 mg daily) and apixaban.  Most recent chest x-ray not suggestive of amiodarone toxicity.  Check TSH, free T4 and liver functions.  Check hemoglobin and renal function.   2 cardiomyopathy-previously felt to be ischemic and nonischemic mix.  Continue ARB.  LV function has improved on most recent echocardiogram.  Patient is not on beta-blockade due to baseline bradycardia.  3 hypertension-blood pressure controlled.  Continue present medications.  4 hyperlipidemia-continue statin.  5 coronary artery disease-patient denies chest pain.  Continue statin.  He is not on aspirin given need for anticoagulation.  6 bradycardia-he is mildly bradycardic today.  However he states his heart rate typically is 50 at home.  He is not having presyncope or syncope.  He is already on low-dose amiodarone at 100 mg daily.  Will follow for now.  Avoid AV nodal blocking agents otherwise.  Olga Millers, MD

## 2023-01-14 ENCOUNTER — Ambulatory Visit: Payer: Medicare Other | Attending: Cardiology | Admitting: Cardiology

## 2023-01-14 ENCOUNTER — Encounter: Payer: Self-pay | Admitting: Cardiology

## 2023-01-14 VITALS — BP 92/68 | HR 44 | Ht 70.0 in | Wt 170.6 lb

## 2023-01-14 DIAGNOSIS — I4891 Unspecified atrial fibrillation: Secondary | ICD-10-CM

## 2023-01-14 DIAGNOSIS — Z79899 Other long term (current) drug therapy: Secondary | ICD-10-CM | POA: Diagnosis present

## 2023-01-14 DIAGNOSIS — I10 Essential (primary) hypertension: Secondary | ICD-10-CM | POA: Diagnosis present

## 2023-01-14 DIAGNOSIS — I2510 Atherosclerotic heart disease of native coronary artery without angina pectoris: Secondary | ICD-10-CM

## 2023-01-14 DIAGNOSIS — I5041 Acute combined systolic (congestive) and diastolic (congestive) heart failure: Secondary | ICD-10-CM | POA: Diagnosis present

## 2023-01-14 DIAGNOSIS — E7800 Pure hypercholesterolemia, unspecified: Secondary | ICD-10-CM | POA: Diagnosis present

## 2023-01-14 NOTE — Patient Instructions (Signed)
Medication Instructions:   Your physician recommends that you continue on your current medications as directed. Please refer to the Current Medication list given to you today.   *If you need a refill on your cardiac medications before your next appointment, please call your pharmacy*   Lab Work: Dr. Jens Som wants you to have these labs drawn today - CBC -BMET -Liver function -TSH + T4F+T3FREE   If you have labs (blood work) drawn today and your tests are completely normal, you will receive your results only by: MyChart Message (if you have MyChart) OR A paper copy in the mail If you have any lab test that is abnormal or we need to change your treatment, we will call you to review the results.      Follow-Up: At [facility], you and your health needs are our priority.  As part of our continuing mission to provide you with exceptional heart care, we have created designated Provider Care Teams.  These Care Teams include your primary Cardiologist (physician) and Advanced Practice Providers (APPs -  Physician Assistants and Nurse Practitioners) who all work together to provide you with the care you need, when you need it.  We recommend signing up for the patient portal called "MyChart".  Sign up information is provided on this After Visit Summary.  MyChart is used to connect with patients for Virtual Visits (Telemedicine).  Patients are able to view lab/test results, encounter notes, upcoming appointments, etc.  Non-urgent messages can be sent to your provider as well.   To learn more about what you can do with MyChart, go to [email or web address].    Your next appointment:   6 month(s)  The format for your next appointment:   In Person  Provider:   Olga Millers, MD

## 2023-01-15 LAB — CBC
Hematocrit: 41.7 % (ref 37.5–51.0)
Hemoglobin: 14 g/dL (ref 13.0–17.7)
MCH: 31 pg (ref 26.6–33.0)
MCHC: 33.6 g/dL (ref 31.5–35.7)
MCV: 93 fL (ref 79–97)
Platelets: 232 10*3/uL (ref 150–450)
RBC: 4.51 x10E6/uL (ref 4.14–5.80)
RDW: 13 % (ref 11.6–15.4)
WBC: 8.3 10*3/uL (ref 3.4–10.8)

## 2023-01-15 LAB — TSH+T4F+T3FREE
Free T4: 1.3 ng/dL (ref 0.82–1.77)
T3, Free: 2.7 pg/mL (ref 2.0–4.4)
TSH: 10.6 u[IU]/mL — ABNORMAL HIGH (ref 0.450–4.500)

## 2023-01-15 LAB — HEPATIC FUNCTION PANEL
ALT: 21 IU/L (ref 0–44)
AST: 25 IU/L (ref 0–40)
Albumin: 4.4 g/dL (ref 3.7–4.7)
Alkaline Phosphatase: 107 IU/L (ref 44–121)
Bilirubin Total: 0.7 mg/dL (ref 0.0–1.2)
Bilirubin, Direct: 0.26 mg/dL (ref 0.00–0.40)
Total Protein: 7 g/dL (ref 6.0–8.5)

## 2023-01-15 LAB — BASIC METABOLIC PANEL
BUN/Creatinine Ratio: 17 (ref 10–24)
BUN: 16 mg/dL (ref 8–27)
CO2: 25 mmol/L (ref 20–29)
Calcium: 9.6 mg/dL (ref 8.6–10.2)
Chloride: 102 mmol/L (ref 96–106)
Creatinine, Ser: 0.96 mg/dL (ref 0.76–1.27)
Glucose: 106 mg/dL — ABNORMAL HIGH (ref 70–99)
Potassium: 5.2 mmol/L (ref 3.5–5.2)
Sodium: 142 mmol/L (ref 134–144)
eGFR: 76 mL/min/{1.73_m2} (ref >59)

## 2023-01-16 ENCOUNTER — Other Ambulatory Visit: Payer: Self-pay | Admitting: Cardiology

## 2023-01-16 DIAGNOSIS — E7800 Pure hypercholesterolemia, unspecified: Secondary | ICD-10-CM

## 2023-02-17 ENCOUNTER — Encounter: Payer: Self-pay | Admitting: Internal Medicine

## 2023-02-17 ENCOUNTER — Ambulatory Visit (INDEPENDENT_AMBULATORY_CARE_PROVIDER_SITE_OTHER): Payer: Medicare Other | Admitting: Internal Medicine

## 2023-02-17 VITALS — BP 130/82 | HR 48 | Temp 97.6°F | Ht 70.0 in | Wt 169.0 lb

## 2023-02-17 DIAGNOSIS — Z7984 Long term (current) use of oral hypoglycemic drugs: Secondary | ICD-10-CM | POA: Diagnosis not present

## 2023-02-17 DIAGNOSIS — E118 Type 2 diabetes mellitus with unspecified complications: Secondary | ICD-10-CM | POA: Diagnosis not present

## 2023-02-17 DIAGNOSIS — E785 Hyperlipidemia, unspecified: Secondary | ICD-10-CM | POA: Diagnosis not present

## 2023-02-17 DIAGNOSIS — E1169 Type 2 diabetes mellitus with other specified complication: Secondary | ICD-10-CM | POA: Diagnosis not present

## 2023-02-17 DIAGNOSIS — Z23 Encounter for immunization: Secondary | ICD-10-CM

## 2023-02-17 LAB — LIPID PANEL
Cholesterol: 123 mg/dL (ref 0–200)
HDL: 53.9 mg/dL (ref >39.00)
LDL Cholesterol: 50 mg/dL (ref 0–99)
NonHDL: 69.51
Total CHOL/HDL Ratio: 2
Triglycerides: 97 mg/dL (ref 0.0–149.0)
VLDL: 19.4 mg/dL (ref 0.0–40.0)

## 2023-02-17 LAB — COMPREHENSIVE METABOLIC PANEL
ALT: 24 U/L (ref 0–53)
AST: 25 U/L (ref 0–37)
Albumin: 4.3 g/dL (ref 3.5–5.2)
Alkaline Phosphatase: 90 U/L (ref 39–117)
BUN: 19 mg/dL (ref 6–23)
CO2: 28 meq/L (ref 19–32)
Calcium: 9.5 mg/dL (ref 8.4–10.5)
Chloride: 104 meq/L (ref 96–112)
Creatinine, Ser: 0.91 mg/dL (ref 0.40–1.50)
GFR: 75.38 mL/min (ref >60.00)
Glucose, Bld: 121 mg/dL — ABNORMAL HIGH (ref 70–99)
Potassium: 4.2 meq/L (ref 3.5–5.1)
Sodium: 139 meq/L (ref 135–145)
Total Bilirubin: 0.9 mg/dL (ref 0.2–1.2)
Total Protein: 6.8 g/dL (ref 6.0–8.3)

## 2023-02-17 LAB — HEMOGLOBIN A1C: Hgb A1c MFr Bld: 6.5 % (ref 4.6–6.5)

## 2023-02-17 NOTE — Progress Notes (Unsigned)
   Subjective:   Patient ID: Vincent Howell, male    DOB: 15-Feb-1933, 89 y.o.   MRN: [id number]  HPI The patient is an 89 YO man coming in for follow up.   Review of Systems  Constitutional: Negative.   HENT: Negative.    Eyes: Negative.   Respiratory:  Negative for cough, chest tightness and shortness of breath.   Cardiovascular:  Negative for chest pain, palpitations and leg swelling.  Gastrointestinal:  Negative for abdominal distention, abdominal pain, constipation, diarrhea, nausea and vomiting.  Musculoskeletal: Negative.   Skin: Negative.   Neurological: Negative.   Psychiatric/Behavioral: Negative.      Objective:  Physical Exam Constitutional:      Appearance: He is well-developed.  HENT:     Head: Normocephalic and atraumatic.  Cardiovascular:     Rate and Rhythm: Normal rate and regular rhythm.  Pulmonary:     Effort: Pulmonary effort is normal. No respiratory distress.     Breath sounds: Normal breath sounds. No wheezing or rales.  Abdominal:     General: Bowel sounds are normal. There is no distension.     Palpations: Abdomen is soft.     Tenderness: There is no abdominal tenderness. There is no rebound.  Musculoskeletal:     Cervical back: Normal range of motion.  Skin:    General: Skin is warm and dry.  Neurological:     Mental Status: He is alert and oriented to person, place, and time.     Coordination: Coordination normal.     Vitals:   02/17/23 1030  BP: 130/82  Pulse: (!) 48  Temp: 97.6 F (36.4 C)  TempSrc: Oral  SpO2: 98%  Weight: 169 lb (76.7 kg)  Height: 5\' 10"  (1.778 m)    Assessment & Plan:  Flu shot given at visit

## 2023-02-17 NOTE — Patient Instructions (Addendum)
We will check the labs today.    

## 2023-02-18 ENCOUNTER — Encounter: Payer: Self-pay | Admitting: Internal Medicine

## 2023-02-18 NOTE — Assessment & Plan Note (Addendum)
Checking HgA1c as due and adjust metformin and amaryl as needed. Is on statin and ARB. No hypoglycemia.

## 2023-02-18 NOTE — Assessment & Plan Note (Signed)
Checking lipid panel and adjust lipitor 20 mg daily and zetia 10 mg daily as needed.

## 2023-03-08 ENCOUNTER — Other Ambulatory Visit: Payer: Self-pay | Admitting: Cardiology

## 2023-03-08 DIAGNOSIS — I4891 Unspecified atrial fibrillation: Secondary | ICD-10-CM

## 2023-03-08 NOTE — Telephone Encounter (Signed)
Eliquis 5mg  refill request received. Patient is 89 years old, weight-76.7kg, Crea-0.91 on 02/17/23, Diagnosis-Afib, and last seen by Dr. Jens Som on 01/14/23. Dose is appropriate based on dosing criteria. Will send in refill to requested pharmacy.

## 2023-03-18 ENCOUNTER — Other Ambulatory Visit: Payer: Self-pay | Admitting: Internal Medicine

## 2023-03-28 ENCOUNTER — Other Ambulatory Visit: Payer: Self-pay | Admitting: Internal Medicine

## 2023-03-30 ENCOUNTER — Encounter (HOSPITAL_BASED_OUTPATIENT_CLINIC_OR_DEPARTMENT_OTHER): Payer: Self-pay | Admitting: Student

## 2023-03-30 ENCOUNTER — Ambulatory Visit (HOSPITAL_BASED_OUTPATIENT_CLINIC_OR_DEPARTMENT_OTHER): Payer: Medicare Other

## 2023-03-30 ENCOUNTER — Ambulatory Visit (INDEPENDENT_AMBULATORY_CARE_PROVIDER_SITE_OTHER): Payer: Medicare Other | Admitting: Student

## 2023-03-30 DIAGNOSIS — S93401A Sprain of unspecified ligament of right ankle, initial encounter: Secondary | ICD-10-CM

## 2023-03-30 DIAGNOSIS — M25571 Pain in right ankle and joints of right foot: Secondary | ICD-10-CM | POA: Diagnosis not present

## 2023-03-30 NOTE — Progress Notes (Unsigned)
Chief Complaint: Right foot pain     History of Present Illness:    Vincent Howell is a 89 y.o. male presenting today for evaluation of pain and swelling in his right foot and ankle.  He reports that 6 days ago he had an inversion injury but denies feeling a popping sensation.  He reports that since the injury his pain has gradually improved and he rates this at a 5/10 today.  His foot and ankle however do remain swollen.  Has been icing and elevating when possible.  Reports that he is able to walk without significant pain but it does hurt to turn his ankle inwards.  He is on daily Eliquis for anticoagulation.   Surgical History:   None  PMH/PSH/Family History/Social History/Meds/Allergies:    Past Medical History:  Diagnosis Date   CAD (coronary artery disease)    Diabetes mellitus without complication (HCC)    Hypercholesteremia    Hypertension    Macular degeneration    Pancreatitis    Prostate cancer (HCC)    Past Surgical History:  Procedure Laterality Date   APPENDECTOMY     BRAIN SURGERY     CHOLECYSTECTOMY     KNEE SURGERY     PROSTATECTOMY     RIGHT/LEFT HEART CATH AND CORONARY ANGIOGRAPHY N/A 08/07/2021   Procedure: RIGHT/LEFT HEART CATH AND CORONARY ANGIOGRAPHY;  Surgeon: Orbie Pyo, MD;  Location: [facility];  Service: Cardiovascular;  Laterality: N/A;   TONSILLECTOMY     TUMOR REMOVAL Right    KNEE   Social History   Socioeconomic History   Marital status: Married    Spouse name: Not on file   Number of children: 2   Years of education: Not on file   Highest education level: Associate degree: occupational, Scientist, product/process development, or vocational program  Occupational History   Not on file  Tobacco Use   Smoking status: Former    Types: Cigarettes   Smokeless tobacco: Never  Vaping Use   Vaping status: Never Used  Substance and Sexual Activity   Alcohol use: Yes    Comment: Occasional   Drug use: Never   Sexual  activity: Not on file  Other Topics Concern   Not on file  Social History Narrative   Not on file   Social Determinants of Health   Financial Resource Strain: Low Risk  (08/15/2022)   Overall Financial Resource Strain (CARDIA)    Difficulty of Paying Living Expenses: Not hard at all  Food Insecurity: No Food Insecurity (08/15/2022)   Hunger Vital Sign    Worried About Running Out of Food in the Last Year: Never true    Ran Out of Food in the Last Year: Never true  Transportation Needs: No Transportation Needs (08/15/2022)   PRAPARE - Administrator, Civil Service (Medical): No    Lack of Transportation (Non-Medical): No  Physical Activity: Sufficiently Active (08/15/2022)   Exercise Vital Sign    Days of Exercise per Week: 5 days    Minutes of Exercise per Session: 30 min  Stress: No Stress Concern Present (08/15/2022)   [employer] of Occupational Health - Occupational Stress Questionnaire    Feeling of Stress : Only a little  Social Connections: Unknown (08/15/2022)   Social Connection and Isolation Panel [NHANES]  Frequency of Communication with Friends and Family: Patient declined    Frequency of Social Gatherings with Friends and Family: Patient declined    Attends Religious Services: Patient declined    Database administrator or Organizations: No    Attends Engineer, structural: Patient declined    Marital Status: Married   Family History  Problem Relation Age of Onset   Heart attack Brother    Allergies  Allergen Reactions   Aspirin Anaphylaxis   Penicillins Anaphylaxis   Acetaminophen Other (See Comments)    Unknown   2,4-D Dimethylamine Rash    Blisters   Benzoin Itching and Rash   Other Rash    Silk tape   Current Outpatient Medications  Medication Sig Dispense Refill   amiodarone (PACERONE) 200 MG tablet TAKE 1/2 TABLET BY MOUTH DAILY 45 tablet 2   apixaban (ELIQUIS) 5 MG TABS tablet TAKE 1 TABLET BY MOUTH TWICE A DAY 60 tablet 5    atorvastatin (LIPITOR) 20 MG tablet TAKE 1 TABLET BY MOUTH EVERY DAY 90 tablet 3   Cholecalciferol (VITAMIN D) 50 MCG (2000 UT) CAPS Take 1 capsule by mouth daily.     ezetimibe (ZETIA) 10 MG tablet TAKE 1 TABLET BY MOUTH EVERY DAY 90 tablet 3   fluticasone (VERAMYST) 27.5 MCG/SPRAY nasal spray Place 2 sprays into the nose daily.     glimepiride (AMARYL) 2 MG tablet TAKE 1 TABLET BY MOUTH DAILY WITH BREAKFAST 90 tablet 2   losartan (COZAAR) 50 MG tablet TAKE 1 TABLET BY MOUTH EVERY DAY 90 tablet 3   metFORMIN (GLUCOPHAGE-XR) 500 MG 24 hr tablet TAKE 3 TABLETS (1,500 MG TOTAL) BY MOUTH DAILY. RESUME ON 08/10/21. 270 tablet 3   Multiple Vitamins-Minerals (ICAPS AREDS 2 PO) Take 2 tablets by mouth daily.     nitroGLYCERIN (NITROSTAT) 0.4 MG SL tablet Place 1 tablet (0.4 mg total) under the tongue every 5 (five) minutes x 3 doses as needed for chest pain. (Patient not taking: Reported on 01/14/2023) 25 tablet 3   pyridOXINE (VITAMIN B-6) 100 MG tablet Take 100 mg by mouth daily.     vitamin B-12 (CYANOCOBALAMIN) 500 MCG tablet Take 500 mcg by mouth daily.     No current facility-administered medications for this visit.   No results found.  Review of Systems:   A ROS was performed including pertinent positives and negatives as documented in the HPI.  Physical Exam :   Constitutional: NAD and appears stated age Neurological: Alert and oriented Psych: Appropriate affect and cooperative There were no vitals taken for this visit.   Comprehensive Musculoskeletal Exam:    Right ankle appears moderately edematous with ecchymosis noted into the lateral foot and scattered throughout all 5 digits.  No significant tenderness over the medial or lateral malleolus.  Active ankle ROM limited to 10 degrees dorsiflexion and plantarflexion.  Tenderness over the ATFL and cuboid.  Dorsalis pedis pulse palpable and capillary refill less than 3 seconds to all 5 digits.  Distal neurosensory exam intact.  Imaging:    Xray (right foot 3 views, right ankle 3 views): No evidence of acute fracture or dislocation.  There is a large calcaneocuboid osteophyte noted on the lateral foot.   I personally reviewed and interpreted the radiographs.   Assessment:   89 y.o. male with 6-day history of right ankle swelling and pain after an inversion injury.  Radiographs taken today do not show any evidence of associated fracture.  Given this I do believe he  likely sustained a lateral ankle sprain.  He does have continued bruising in the foot which is likely an effect of his Eliquis.  I have recommended a low-profile ankle brace that he can use for added stability.  Also recommend continuing with ice and elevation to decrease swelling.  I will plan to see him back as needed.  Plan :    -Return to clinic as needed     I personally saw and evaluated the patient, and participated in the management and treatment plan.  Hazle Nordmann, PA-C Orthopedics

## 2023-03-31 ENCOUNTER — Ambulatory Visit (HOSPITAL_BASED_OUTPATIENT_CLINIC_OR_DEPARTMENT_OTHER): Payer: Medicare Other | Admitting: Student

## 2023-05-25 DIAGNOSIS — R2689 Other abnormalities of gait and mobility: Secondary | ICD-10-CM | POA: Insufficient documentation

## 2023-05-25 DIAGNOSIS — Z9181 History of falling: Secondary | ICD-10-CM | POA: Insufficient documentation

## 2023-05-25 DIAGNOSIS — H3530 Unspecified macular degeneration: Secondary | ICD-10-CM | POA: Insufficient documentation

## 2023-06-08 ENCOUNTER — Ambulatory Visit (INDEPENDENT_AMBULATORY_CARE_PROVIDER_SITE_OTHER): Payer: Medicare Other

## 2023-06-08 VITALS — Ht 70.0 in | Wt 169.0 lb

## 2023-06-08 DIAGNOSIS — Z0000 Encounter for general adult medical examination without abnormal findings: Secondary | ICD-10-CM | POA: Diagnosis not present

## 2023-06-08 NOTE — Patient Instructions (Addendum)
Vincent Howell , Thank you for taking time to come for your Medicare Wellness Visit. I appreciate your ongoing commitment to your health goals. Please review the following plan we discussed and let me know if I can assist you in the future.   Referrals/Orders/Follow-Ups/Clinician Recommendations: It was nice talking to today.  You are due for a Tetanus vaccine and a Pneumonia vaccine.  Please let us know of any records retrieved from your previous provider.  Keep up the good work.  This is a list of the screening recommended for you and due dates:  Health Maintenance  Topic Date Due   Pneumonia Vaccine (1 of 2 - PCV) Never done   DTaP/Tdap/Td vaccine (1 - Tdap) Never done   COVID-19 Vaccine (7 - 2024-25 season) 05/04/2023   Eye exam for diabetics  07/29/2023   Hemoglobin A1C  08/18/2023   Complete foot exam   02/17/2024   Medicare Annual Wellness Visit  06/07/2024   Flu Shot  Completed   Zoster (Shingles) Vaccine  Completed   HPV Vaccine  Aged Out    Advanced directives: (Copy Requested) Please bring a copy of your health care power of attorney and living will to the office to be added to your chart at your convenience.  Next Medicare Annual Wellness Visit scheduled for next year: Yes

## 2023-06-08 NOTE — Progress Notes (Signed)
Subjective:   Vincent Howell is a 89 y.o. male who presents for Medicare Annual/Subsequent preventive examination.  Visit Complete: Virtual I connected with  Alquan Morrish Dada on 06/08/23 by a audio enabled telemedicine application and verified that I am speaking with the correct person using two identifiers.  Patient Location: Home  Provider Location: Office/Clinic  I discussed the limitations of evaluation and management by telemedicine. The patient expressed understanding and agreed to proceed.  Vital Signs: Because this visit was a virtual/telehealth visit, some criteria may be missing or patient reported. Any vitals not documented were not able to be obtained and vitals that have been documented are patient reported.  Patient Medicare AWV questionnaire was completed by the patient on 06/03/2023; I have confirmed that all information answered by patient is correct and no changes since this date.  Cardiac Risk Factors include: advanced age (>17men, >61 women);male gender;Other (see comment);dyslipidemia;diabetes mellitus, Risk factor comments: A-Fib, CAD, CHF     Objective:    Today's Vitals   06/08/23 1009  Weight: 169 lb (76.7 kg)  Height: 5\' 10"  (1.778 m)   Body mass index is 24.25 kg/m.     06/08/2023   10:17 AM 06/03/2022   11:34 AM 08/08/2021   12:00 PM 08/05/2021    5:04 PM 06/29/2021    9:01 AM  Advanced Directives  Does Patient Have a Medical Advance Directive? Yes Yes Yes No No  Type of Estate agent of [place];Living will Healthcare Power of [place];Living will Healthcare Power of [place];Living will    Does patient want to make changes to medical advance directive?   No - Patient declined    Copy of Healthcare Power of Attorney in Chart? No - copy requested No - copy requested No - copy requested    Would patient like information on creating a medical advance directive?     No - Patient declined    Current Medications  (verified) Outpatient Encounter Medications as of 06/08/2023  Medication Sig   amiodarone (PACERONE) 200 MG tablet TAKE 1/2 TABLET BY MOUTH DAILY   apixaban (ELIQUIS) 5 MG TABS tablet TAKE 1 TABLET BY MOUTH TWICE A DAY   atorvastatin (LIPITOR) 20 MG tablet TAKE 1 TABLET BY MOUTH EVERY DAY   Cholecalciferol (VITAMIN D) 50 MCG (2000 UT) CAPS Take 1 capsule by mouth daily.   ezetimibe (ZETIA) 10 MG tablet TAKE 1 TABLET BY MOUTH EVERY DAY   fluticasone (VERAMYST) 27.5 MCG/SPRAY nasal spray Place 2 sprays into the nose daily.   glimepiride (AMARYL) 2 MG tablet TAKE 1 TABLET BY MOUTH DAILY WITH BREAKFAST   losartan (COZAAR) 50 MG tablet TAKE 1 TABLET BY MOUTH EVERY DAY   metFORMIN (GLUCOPHAGE-XR) 500 MG 24 hr tablet TAKE 3 TABLETS (1,500 MG TOTAL) BY MOUTH DAILY. RESUME ON 08/10/21.   Multiple Vitamins-Minerals (ICAPS AREDS 2 PO) Take 2 tablets by mouth daily.   nitroGLYCERIN (NITROSTAT) 0.4 MG SL tablet Place 1 tablet (0.4 mg total) under the tongue every 5 (five) minutes x 3 doses as needed for chest pain.   pyridOXINE (VITAMIN B-6) 100 MG tablet Take 100 mg by mouth daily.   vitamin B-12 (CYANOCOBALAMIN) 500 MCG tablet Take 500 mcg by mouth daily.   No facility-administered encounter medications on file as of 06/08/2023.    Allergies (verified) Aspirin; Penicillins; Acetaminophen; 2,4-d dimethylamine; Benzoin; and Other   History: Past Medical History:  Diagnosis Date   CAD (coronary artery disease)    Diabetes mellitus without complication (HCC)  Hypercholesteremia    Hypertension    Macular degeneration    Pancreatitis    Prostate cancer [facility])    Past Surgical History:  Procedure Laterality Date   APPENDECTOMY     BRAIN SURGERY     CHOLECYSTECTOMY     KNEE SURGERY     PROSTATECTOMY     RIGHT/LEFT HEART CATH AND CORONARY ANGIOGRAPHY N/A 08/07/2021   Procedure: RIGHT/LEFT HEART CATH AND CORONARY ANGIOGRAPHY;  Surgeon: Orbie Pyo, MD;  Location: [facility];  Service:  Cardiovascular;  Laterality: N/A;   TONSILLECTOMY     TUMOR REMOVAL Right    KNEE   Family History  Problem Relation Age of Onset   Heart attack Brother    Social History   Socioeconomic History   Marital status: Married    Spouse name: Maralyn Sago   Number of children: 2   Years of education: Not on file   Highest education level: Associate degree: occupational, Scientist, product/process development, or vocational program  Occupational History   Occupation: RETIRED  Tobacco Use   Smoking status: Former    Types: Cigarettes   Smokeless tobacco: Never  Vaping Use   Vaping status: Never Used  Substance and Sexual Activity   Alcohol use: Yes    Comment: Occasional   Drug use: Never   Sexual activity: Not on file  Other Topics Concern   Not on file  Social History Narrative   Lives with wife.   Social Drivers of Corporate investment banker Strain: Low Risk  (06/08/2023)   Overall Financial Resource Strain (CARDIA)    Difficulty of Paying Living Expenses: Not very hard  Food Insecurity: No Food Insecurity (06/08/2023)   Hunger Vital Sign    Worried About Running Out of Food in the Last Year: Never true    Ran Out of Food in the Last Year: Never true  Transportation Needs: No Transportation Needs (06/08/2023)   PRAPARE - Administrator, Civil Service (Medical): No    Lack of Transportation (Non-Medical): No  Physical Activity: Sufficiently Active (06/08/2023)   Exercise Vital Sign    Days of Exercise per Week: 5 days    Minutes of Exercise per Session: 30 min  Stress: Stress Concern Present (06/08/2023)   [employer] of Occupational Health - Occupational Stress Questionnaire    Feeling of Stress : To some extent  Social Connections: Moderately Isolated (06/08/2023)   Social Connection and Isolation Panel [NHANES]    Frequency of Communication with Friends and Family: Twice a week    Frequency of Social Gatherings with Friends and Family: Once a week    Attends Religious Services: Never     Database administrator or Organizations: No    Attends Engineer, structural: Never    Marital Status: Married    Tobacco Counseling Counseling given: Not Answered   Clinical Intake:  Pre-visit preparation completed: Yes  Pain : No/denies pain     BMI - recorded: 24.25 Nutritional Status: BMI of 19-24  Normal Nutritional Risks: None Diabetes: Yes CBG done?: No Did pt. bring in CBG monitor from home?: No  How often do you need to have someone help you when you read instructions, pamphlets, or other written materials from your doctor or pharmacy?: 1 - Never     Information entered by :: Ronnell Makarewicz, RMA   Activities of Daily Living    06/03/2023    8:55 PM  In your present state of health, do you  have any difficulty performing the following activities:  Hearing? 1  Vision? 0  Difficulty concentrating or making decisions? 0  Walking or climbing stairs? 0  Dressing or bathing? 0  Doing errands, shopping? 0  Preparing Food and eating ? N  Using the Toilet? N  In the past six months, have you accidently leaked urine? Y  Do you have problems with loss of bowel control? N  Managing your Medications? N  Managing your Finances? N  Housekeeping or managing your Housekeeping? N    Patient Care Team: Myrlene Broker, MD as PCP - General (Internal Medicine) Jens Som Madolyn Frieze, MD as PCP - Cardiology (Cardiology) Genia Del Daisy Blossom, MD as Consulting Physician (Ophthalmology)  Indicate any recent Medical Services you may have received from other than [facility] providers in the past year (date may be approximate).     Assessment:   This is a routine wellness examination for Gery.  Hearing/Vision screen Hearing Screening - Comments:: [facility] hearing aides Vision Screening - Comments:: Wears eyeglasses   Goals Addressed             This Visit's Progress    My healthcare goal for 2024 is to maintain my current health status by continuing to eat  healthy, stay physically active and socially active.   On track     Depression Screen    06/08/2023   10:22 AM 06/03/2022   11:32 AM 02/17/2022   10:53 AM  PHQ 2/9 Scores  PHQ - 2 Score 0 0 0  PHQ- 9 Score 3  0    Fall Risk    06/03/2023    8:55 PM 02/17/2023   10:32 AM 06/03/2022   11:36 AM 05/30/2022    9:19 AM 02/17/2022   10:52 AM  Fall Risk   Falls in the past year? 1 1 1 1  0  Number falls in past yr: 1 0 1 1 0  Injury with Fall? 1 0 0 0 0  Risk for fall due to :   History of fall(s)  No Fall Risks  Follow up Falls evaluation completed;Falls prevention discussed Falls evaluation completed Falls evaluation completed  Falls evaluation completed    MEDICARE RISK AT HOME: Medicare Risk at Home Any stairs in or around the home?: (Patient-Rptd) Yes If so, are there any without handrails?: (Patient-Rptd) No Home free of loose throw rugs in walkways, pet beds, electrical cords, etc?: (Patient-Rptd) Yes Adequate lighting in your home to reduce risk of falls?: (Patient-Rptd) Yes Life alert?: (Patient-Rptd) No Use of a cane, walker or w/c?: (Patient-Rptd) No Grab bars in the bathroom?: (Patient-Rptd) No Shower chair or bench in shower?: (Patient-Rptd) Yes Elevated toilet seat or a handicapped toilet?: (Patient-Rptd) No  TIMED UP AND GO:  Was the test performed?  No    Cognitive Function:        06/08/2023   10:11 AM 06/03/2022   10:21 AM  6CIT Screen  What Year? 0 points 0 points  What month? 0 points 0 points  What time? 0 points 0 points  Count back from 20 0 points 0 points  Months in reverse 0 points 0 points  Repeat phrase 0 points 0 points  Total Score 0 points 0 points    Immunizations Immunization History  Administered Date(s) Administered   Fluad Quad(high Dose 65+) 02/17/2022   Fluad Trivalent(High Dose 65+) 02/17/2023   Influenza-Unspecified 02/17/2021   Moderna Covid-19 Fall Seasonal Vaccine 40yrs & older 03/09/2023   PFIZER(Purple Top)SARS-COV-2  Vaccination 05/23/2019,  06/12/2019, 02/05/2020, 12/31/2020, 04/30/2021   Zoster Recombinant(Shingrix) 07/11/2020, 12/10/2020    TDAP status: Due, Education has been provided regarding the importance of this vaccine. Advised may receive this vaccine at local pharmacy or Health Dept. Aware to provide a copy of the vaccination record if obtained from local pharmacy or Health Dept. Verbalized acceptance and understanding.  Flu Vaccine status: Up to date  Pneumococcal vaccine status: Due, Education has been provided regarding the importance of this vaccine. Advised may receive this vaccine at local pharmacy or Health Dept. Aware to provide a copy of the vaccination record if obtained from local pharmacy or Health Dept. Verbalized acceptance and understanding.  Covid-19 vaccine status: Completed vaccines  Qualifies for Shingles Vaccine? Yes   Zostavax completed Yes   Shingrix Completed?: Yes  Screening Tests Health Maintenance  Topic Date Due   Pneumonia Vaccine 58+ Years old (1 of 2 - PCV) Never done   DTaP/Tdap/Td (1 - Tdap) Never done   COVID-19 Vaccine (7 - 2024-25 season) 05/04/2023   OPHTHALMOLOGY EXAM  07/29/2023   HEMOGLOBIN A1C  08/18/2023   FOOT EXAM  02/17/2024   Medicare Annual Wellness (AWV)  06/07/2024   INFLUENZA VACCINE  Completed   Zoster Vaccines- Shingrix  Completed   HPV VACCINES  Aged Out    Health Maintenance  Health Maintenance Due  Topic Date Due   Pneumonia Vaccine 44+ Years old (1 of 2 - PCV) Never done   DTaP/Tdap/Td (1 - Tdap) Never done   COVID-19 Vaccine (7 - 2024-25 season) 05/04/2023    Colorectal cancer screening: No longer required.   Lung Cancer Screening: (Low Dose CT Chest recommended if Age 49-80 years, 20 pack-year currently smoking OR have quit w/in 15years.) does not qualify.   Lung Cancer Screening Referral: N/A  Additional Screening:  Hepatitis C Screening: does not qualify;   Vision Screening: Recommended annual ophthalmology  exams for early detection of glaucoma and other disorders of the eye. Is the patient up to date with their annual eye exam?  Yes  Who is the provider or what is the name of the office in which the patient attends annual eye exams? [facility] If pt is not established with a provider, would they like to be referred to a provider to establish care? No .   Dental Screening: Recommended annual dental exams for proper oral hygiene  Diabetic Foot Exam: Diabetic Foot Exam: Completed 02/17/2023  Community Resource Referral / Chronic Care Management: CRR required this visit?  No   CCM required this visit?  No     Plan:     I have personally reviewed and noted the following in the patient's chart:   Medical and social history Use of alcohol, tobacco or illicit drugs  Current medications and supplements including opioid prescriptions. Patient is not currently taking opioid prescriptions. Functional ability and status Nutritional status Physical activity Advanced directives List of other physicians Hospitalizations, surgeries, and ER visits in previous 12 months Vitals Screenings to include cognitive, depression, and falls Referrals and appointments  In addition, I have reviewed and discussed with patient certain preventive protocols, quality metrics, and best practice recommendations. A written personalized care plan for preventive services as well as general preventive health recommendations were provided to patient.     Esti Demello L Seiya Silsby, CMA   06/08/2023   After Visit Summary: (MyChart) Due to this being a telephonic visit, the after visit summary with patients personalized plan was offered to patient via MyChart   Nurse  Notes: Patient is due for a Tdap and a Pneumonia vaccine.  He is up to date with all other health maintenance and has no concerns to address today.

## 2023-07-07 ENCOUNTER — Other Ambulatory Visit: Payer: Self-pay | Admitting: Cardiology

## 2023-07-13 NOTE — Progress Notes (Unsigned)
 HPI: FU atrial fibrillation, syncope and FU coronary artery disease.  Patient apparently had angioplasty of unknown vessel at [facility] in the 1980s.  Patient admitted from the office March 2023 following syncopal episode.  Cardiac catheterization revealed 90% mid LAD, 60% proximal LAD and 80% third posterior lateral. Given that patient was not having chest pain he has been treated medically.  During that admission the patient developed atrial fibrillation with rapid ventricular response.  He was treated with IV amiodarone and converted to sinus rhythm.  He was discharged with an outpatient monitor which was performed March 2023 and showed sinus rhythm with PACs, PAT, paroxysmal atrial fibrillation and 7 beats nonsustained ventricular tachycardia.  Echocardiogram September 2023 showed ejection fraction 50 to 55%, mild left ventricular enlargement, mild left ventricular hypertrophy, moderate left atrial enlargement, mild mitral regurgitation, mild aortic insufficiency.  Since last seen he denies dyspnea on exertion, orthopnea, PND, pedal edema, bleeding, chest pain or syncope.  He has fallen several times when turning fast.  He loses his balance and there is no syncope.  Current Outpatient Medications  Medication Sig Dispense Refill   amiodarone (PACERONE) 200 MG tablet TAKE 1/2 TABLET BY MOUTH DAILY 45 tablet 2   apixaban (ELIQUIS) 5 MG TABS tablet TAKE 1 TABLET BY MOUTH TWICE A DAY 60 tablet 5   atorvastatin (LIPITOR) 20 MG tablet TAKE 1 TABLET BY MOUTH EVERY DAY 90 tablet 3   Cholecalciferol (VITAMIN D) 50 MCG (2000 UT) CAPS Take 1 capsule by mouth daily.     ezetimibe (ZETIA) 10 MG tablet TAKE 1 TABLET BY MOUTH EVERY DAY 90 tablet 3   fluticasone (VERAMYST) 27.5 MCG/SPRAY nasal spray Place 2 sprays into the nose daily.     glimepiride (AMARYL) 2 MG tablet TAKE 1 TABLET BY MOUTH DAILY WITH BREAKFAST 90 tablet 2   losartan (COZAAR) 50 MG tablet TAKE 1 TABLET BY MOUTH EVERY DAY 90 tablet 2    metFORMIN (GLUCOPHAGE-XR) 500 MG 24 hr tablet TAKE 3 TABLETS (1,500 MG TOTAL) BY MOUTH DAILY. RESUME ON 08/10/21. 270 tablet 3   Multiple Vitamins-Minerals (ICAPS AREDS 2 PO) Take 2 tablets by mouth daily.     nitroGLYCERIN (NITROSTAT) 0.4 MG SL tablet Place 1 tablet (0.4 mg total) under the tongue every 5 (five) minutes x 3 doses as needed for chest pain. 25 tablet 3   pyridOXINE (VITAMIN B-6) 100 MG tablet Take 100 mg by mouth daily.     vitamin B-12 (CYANOCOBALAMIN) 500 MCG tablet Take 500 mcg by mouth daily.     No current facility-administered medications for this visit.     Past Medical History:  Diagnosis Date   CAD (coronary artery disease)    Diabetes mellitus without complication (HCC)    Hypercholesteremia    Hypertension    Macular degeneration    Pancreatitis    Prostate cancer (HCC)     Past Surgical History:  Procedure Laterality Date   APPENDECTOMY     BRAIN SURGERY     CHOLECYSTECTOMY     KNEE SURGERY     PROSTATECTOMY     RIGHT/LEFT HEART CATH AND CORONARY ANGIOGRAPHY N/A 08/07/2021   Procedure: RIGHT/LEFT HEART CATH AND CORONARY ANGIOGRAPHY;  Surgeon: Orbie Pyo, MD;  [facility];  Service: Cardiovascular;  Laterality: N/A;   TONSILLECTOMY     TUMOR REMOVAL Right    KNEE    Social History   Socioeconomic History   Marital status: Married    Spouse  name: Maralyn Sago   Number of children: 2   Years of education: Not on file   Highest education level: Associate degree: occupational, Scientist, product/process development, or vocational program  Occupational History   Occupation: RETIRED  Tobacco Use   Smoking status: Former    Types: Cigarettes   Smokeless tobacco: Never  Vaping Use   Vaping status: Never Used  Substance and Sexual Activity   Alcohol use: Yes    Comment: Occasional   Drug use: Never   Sexual activity: Not on file  Other Topics Concern   Not on file  Social History Narrative   Lives with wife.   Social Drivers of Research scientist (physical sciences) Strain: Low Risk  (06/08/2023)   Overall Financial Resource Strain (CARDIA)    Difficulty of Paying Living Expenses: Not very hard  Food Insecurity: No Food Insecurity (06/08/2023)   Hunger Vital Sign    Worried About Running Out of Food in the Last Year: Never true    Ran Out of Food in the Last Year: Never true  Transportation Needs: No Transportation Needs (06/08/2023)   PRAPARE - Administrator, Civil Service (Medical): No    Lack of Transportation (Non-Medical): No  Physical Activity: Sufficiently Active (06/08/2023)   Exercise Vital Sign    Days of Exercise per Week: 5 days    Minutes of Exercise per Session: 30 min  Stress: Stress Concern Present (06/08/2023)   [employer] of Occupational Health - Occupational Stress Questionnaire    Feeling of Stress : To some extent  Social Connections: Moderately Isolated (06/08/2023)   Social Connection and Isolation Panel [NHANES]    Frequency of Communication with Friends and Family: Twice a week    Frequency of Social Gatherings with Friends and Family: Once a week    Attends Religious Services: Never    Database administrator or Organizations: No    Attends Banker Meetings: Never    Marital Status: Married  Catering manager Violence: Not At Risk (06/08/2023)   Humiliation, Afraid, Rape, and Kick questionnaire    Fear of Current or Ex-Partner: No    Emotionally Abused: No    Physically Abused: No    Sexually Abused: No    Family History  Problem Relation Age of Onset   Heart attack Brother     ROS: no fevers or chills, productive cough, hemoptysis, dysphasia, odynophagia, melena, hematochezia, dysuria, hematuria, rash, seizure activity, orthopnea, PND, pedal edema, claudication. Remaining systems are negative.  Physical Exam: Well-developed well-nourished in no acute distress.  Skin is warm and dry.  HEENT is normal.  Neck is supple.  Chest is clear to auscultation with normal expansion.   Cardiovascular exam is regular rate and rhythm.  Abdominal exam nontender or distended. No masses palpated. Extremities show no edema. neuro grossly intact  EKG Interpretation Date/Time:  Thursday July 15 2023 09:38:49 EST Ventricular Rate:  46 PR Interval:  186 QRS Duration:  116 QT Interval:  492 QTC Calculation: 430 R Axis:   -24  Text Interpretation: Sinus bradycardia Incomplete right bundle branch block When compared with ECG of 14-Jan-2023 10:50, No significant change was found Confirmed by Olga Millers ([id number]) on 07/15/2023 9:40:25 AM    A/P  1 paroxysmal atrial fibrillation-patient remains in sinus rhythm.  Continue amiodarone and apixaban at present dose.  Check chest x-ray, TSH/free T4, liver functions, hemoglobin and renal function.  Note we had a long discussion today concerning risk and benefits of anticoagulation.  He has fallen several times but states it was when he was turning.  He is being more careful and has not fallen recently.  We discussed the potential for Watchman device in the future if we feel the risk of anticoagulation outweighs the benefit.  I will see him back in 6 months to review the above.  2 cardiomyopathy-this is previously felt to be a combination of ischemic and nonischemic.  LV function has improved on most recent echocardiogram.  Continue ARB.  Patient is not on a beta-blocker due to resting bradycardia.  3 hyperlipidemia-continue statin.  4 coronary artery disease-patient is not having chest pain.  Continue statin.  No aspirin given need for apixaban.  5 hypertension-blood pressure controlled.  Continue present medical regimen.  6 history of bradycardia-we will continue to avoid beta-blockers.  Continue amiodarone at 100 mg daily as he is maintaining sinus rhythm on this dose.  Olga Millers, MD

## 2023-07-15 ENCOUNTER — Ambulatory Visit: Payer: Medicare Other | Attending: Cardiology | Admitting: Cardiology

## 2023-07-15 ENCOUNTER — Encounter: Payer: Self-pay | Admitting: Cardiology

## 2023-07-15 VITALS — BP 118/68 | HR 64 | Resp 16 | Ht 70.0 in | Wt 170.0 lb

## 2023-07-15 DIAGNOSIS — E7800 Pure hypercholesterolemia, unspecified: Secondary | ICD-10-CM

## 2023-07-15 DIAGNOSIS — I4891 Unspecified atrial fibrillation: Secondary | ICD-10-CM

## 2023-07-15 DIAGNOSIS — I2510 Atherosclerotic heart disease of native coronary artery without angina pectoris: Secondary | ICD-10-CM | POA: Diagnosis present

## 2023-07-15 DIAGNOSIS — I10 Essential (primary) hypertension: Secondary | ICD-10-CM | POA: Diagnosis present

## 2023-07-15 LAB — TSH: TSH: 10.5 u[IU]/mL — ABNORMAL HIGH (ref 0.450–4.500)

## 2023-07-15 LAB — HEPATIC FUNCTION PANEL
ALT: 17 IU/L (ref 0–44)
AST: 23 IU/L (ref 0–40)
Albumin: 4.4 g/dL (ref 3.7–4.7)
Alkaline Phosphatase: 133 IU/L — ABNORMAL HIGH (ref 44–121)
Bilirubin Total: 0.6 mg/dL (ref 0.0–1.2)
Bilirubin, Direct: 0.29 mg/dL (ref 0.00–0.40)
Total Protein: 7.3 g/dL (ref 6.0–8.5)

## 2023-07-15 LAB — BASIC METABOLIC PANEL
BUN/Creatinine Ratio: 19 (ref 10–24)
BUN: 19 mg/dL (ref 8–27)
CO2: 23 mmol/L (ref 20–29)
Calcium: 9.7 mg/dL (ref 8.6–10.2)
Chloride: 107 mmol/L — ABNORMAL HIGH (ref 96–106)
Creatinine, Ser: 1 mg/dL (ref 0.76–1.27)
Glucose: 118 mg/dL — ABNORMAL HIGH (ref 70–99)
Potassium: 4.7 mmol/L (ref 3.5–5.2)
Sodium: 145 mmol/L — ABNORMAL HIGH (ref 134–144)
eGFR: 72 mL/min/{1.73_m2} (ref >59)

## 2023-07-15 LAB — T4, FREE: Free T4: 1.29 ng/dL (ref 0.82–1.77)

## 2023-07-15 NOTE — Patient Instructions (Signed)
 Medication Instructions:  Your physician recommends that you continue on your current medications as directed. Please refer to the Current Medication list given to you today. *If you need a refill on your cardiac medications before your next appointment, please call your pharmacy*   Lab Work: CBC, BMET, LFT, TSH, Free T4 - please have this completed today at [facility] If you have labs (blood work) drawn today and your tests are completely normal, you will receive your results only by: MyChart Message (if you have MyChart) OR A paper copy in the mail If you have any lab test that is abnormal or we need to change your treatment, we will call you to review the results.   Testing/Procedures: Chest Xray To be completed at [facility] [address] [place], [place], [place] [address] - no appointment required   Follow-Up: At [facility], you and your health needs are our priority.  As part of our continuing mission to provide you with exceptional heart care, we have created designated Provider Care Teams.  These Care Teams include your primary Cardiologist (physician) and Advanced Practice Providers (APPs -  Physician Assistants and Nurse Practitioners) who all work together to provide you with the care you need, when you need it.  We recommend signing up for the patient portal called "MyChart".  Sign up information is provided on this After Visit Summary.  MyChart is used to connect with patients for Virtual Visits (Telemedicine).  Patients are able to view lab/test results, encounter notes, upcoming appointments, etc.  Non-urgent messages can be sent to your provider as well.   To learn more about what you can do with MyChart, go to [email or web address].    Your next appointment:   6 month(s)  Provider:   Olga Millers, MD

## 2023-07-16 LAB — CBC
Hematocrit: 42.9 % (ref 37.5–51.0)
Hemoglobin: 13.9 g/dL (ref 13.0–17.7)
MCH: 29.1 pg (ref 26.6–33.0)
MCHC: 32.4 g/dL (ref 31.5–35.7)
MCV: 90 fL (ref 79–97)
Platelets: 233 10*3/uL (ref 150–450)
RBC: 4.77 x10E6/uL (ref 4.14–5.80)
RDW: 14 % (ref 11.6–15.4)
WBC: 7.2 10*3/uL (ref 3.4–10.8)

## 2023-07-26 ENCOUNTER — Ambulatory Visit
Admission: RE | Admit: 2023-07-26 | Discharge: 2023-07-26 | Disposition: A | Discharge status: Home / Self Care | Source: Ambulatory Visit | Attending: Cardiology | Admitting: Cardiology

## 2023-07-26 DIAGNOSIS — I4891 Unspecified atrial fibrillation: Secondary | ICD-10-CM

## 2023-07-26 DIAGNOSIS — I2510 Atherosclerotic heart disease of native coronary artery without angina pectoris: Secondary | ICD-10-CM

## 2023-07-26 DIAGNOSIS — I10 Essential (primary) hypertension: Secondary | ICD-10-CM

## 2023-07-26 DIAGNOSIS — E7800 Pure hypercholesterolemia, unspecified: Secondary | ICD-10-CM

## 2023-07-27 ENCOUNTER — Encounter: Payer: Self-pay | Admitting: *Deleted

## 2023-08-18 ENCOUNTER — Encounter: Payer: Self-pay | Admitting: Internal Medicine

## 2023-08-18 ENCOUNTER — Ambulatory Visit: Payer: Medicare Other | Admitting: Internal Medicine

## 2023-08-18 VITALS — BP 110/56 | HR 49 | Temp 97.5°F | Ht 70.0 in | Wt 168.0 lb

## 2023-08-18 DIAGNOSIS — Z7984 Long term (current) use of oral hypoglycemic drugs: Secondary | ICD-10-CM

## 2023-08-18 DIAGNOSIS — E785 Hyperlipidemia, unspecified: Secondary | ICD-10-CM

## 2023-08-18 DIAGNOSIS — E1169 Type 2 diabetes mellitus with other specified complication: Secondary | ICD-10-CM

## 2023-08-18 DIAGNOSIS — E118 Type 2 diabetes mellitus with unspecified complications: Secondary | ICD-10-CM

## 2023-08-18 LAB — HEMOGLOBIN A1C: Hgb A1c MFr Bld: 6.8 % — ABNORMAL HIGH (ref 4.6–6.5)

## 2023-08-18 LAB — LIPID PANEL
Cholesterol: 129 mg/dL (ref 0–200)
HDL: 55.5 mg/dL (ref >39.00)
LDL Cholesterol: 57 mg/dL (ref 0–99)
NonHDL: 73.05
Total CHOL/HDL Ratio: 2
Triglycerides: 78 mg/dL (ref 0.0–149.0)
VLDL: 15.6 mg/dL (ref 0.0–40.0)

## 2023-08-18 NOTE — Progress Notes (Unsigned)
   Subjective:   Patient ID: MUNEER LEIDER, male    DOB: 06/19/1932, 89 y.o.   MRN: [id number]  HPI The patient is an 89 YO man coming in for medical management (see A/P for details)  Review of Systems  Objective:  Physical Exam  Vitals:   08/18/23 1046  BP: (!) 110/56  Pulse: (!) 49  Temp: (!) 97.5 F (36.4 C)  SpO2: 99%  Weight: 168 lb (76.2 kg)  Height: 5\' 10"  (1.778 m)    Assessment & Plan:

## 2023-08-19 ENCOUNTER — Encounter: Payer: Self-pay | Admitting: Internal Medicine

## 2023-08-20 NOTE — Assessment & Plan Note (Signed)
 Checking HGA1c and lipid panel. Adjust as needed metformin and amaryl. Is on ARB and statin.

## 2023-08-20 NOTE — Assessment & Plan Note (Signed)
Checking lipid panel and adjust statin as needed.  

## 2023-09-08 ENCOUNTER — Other Ambulatory Visit: Payer: Self-pay | Admitting: Cardiology

## 2023-09-08 DIAGNOSIS — I4891 Unspecified atrial fibrillation: Secondary | ICD-10-CM

## 2023-09-08 NOTE — Telephone Encounter (Signed)
 Prescription refill request for Eliquis  received. Indication: Afib  Last office visit: 07/15/23 Vincent Howell)  Scr: 1.00 (07/15/23)  Age: 89 Weight: 76.2kg  Appropriate dose. Refill sent.

## 2023-09-27 ENCOUNTER — Other Ambulatory Visit: Payer: Self-pay | Admitting: Internal Medicine

## 2023-10-12 IMAGING — CT CT HEAD W/O CM
4 series · 16 of 47 positions shown, 18 images · non-contrast
Comparison: None.

CLINICAL DATA: Altered mental status.



[Series 2: head wo · axial · 0.43mm/px · z∈[-135,-15]mm · 7 of 34 slices shown, 9 images]
[im 5/34  brain]
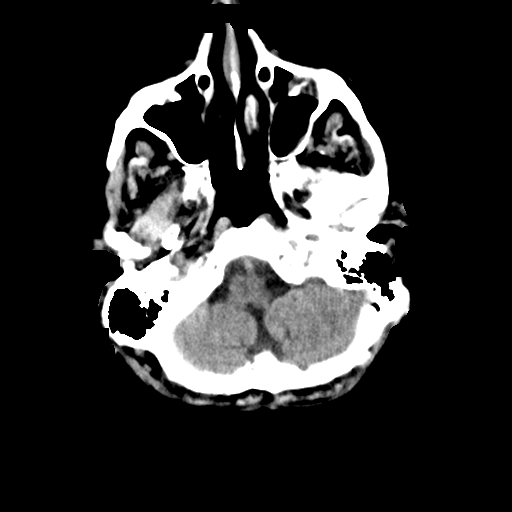
[im 5/34  bone]
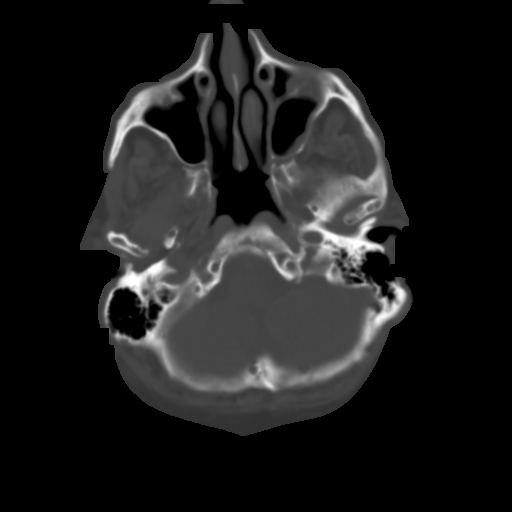
[im 9/34  brain]
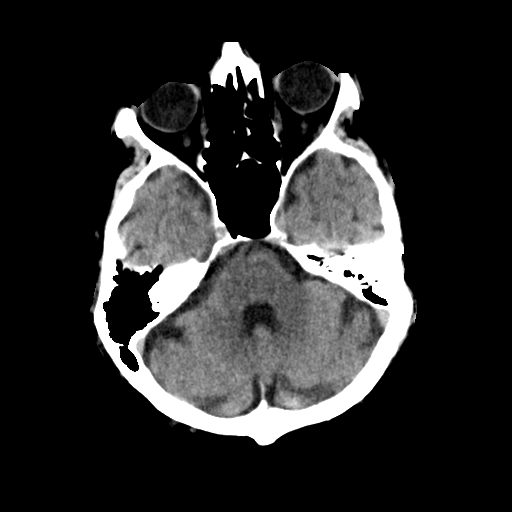
[im 13/34  brain]
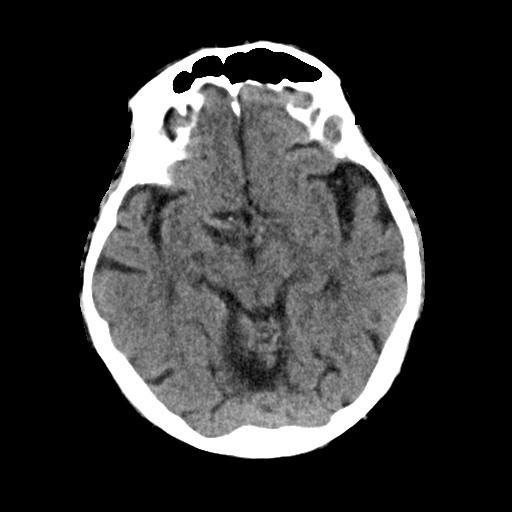
[im 17/34  brain]
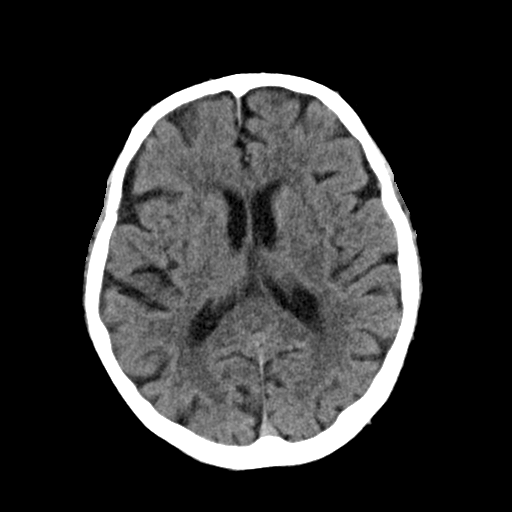
[im 21/34  brain]
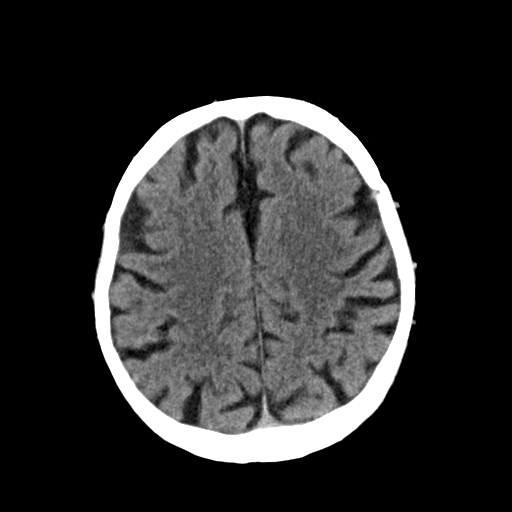
[im 21/34  bone]
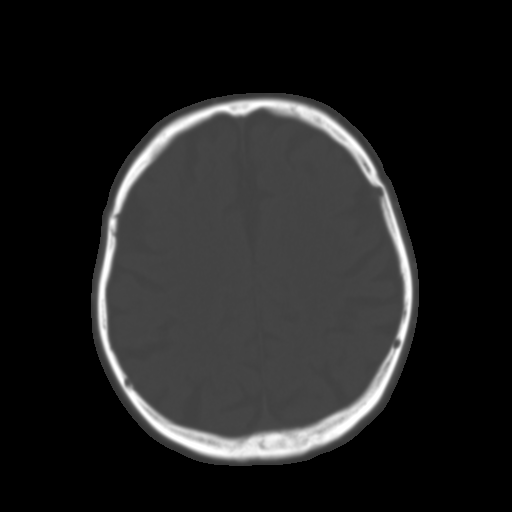
[im 25/34  brain]
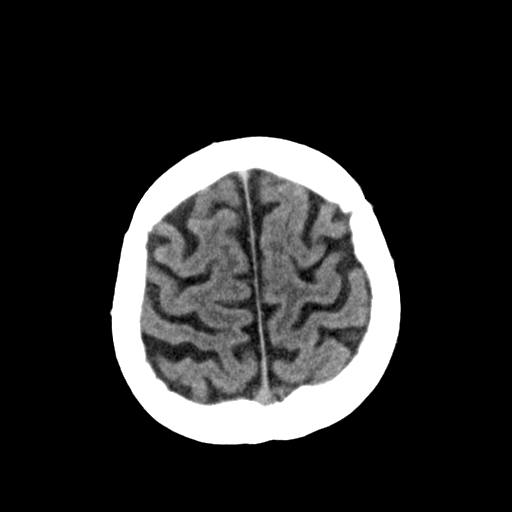
[im 29/34  brain]
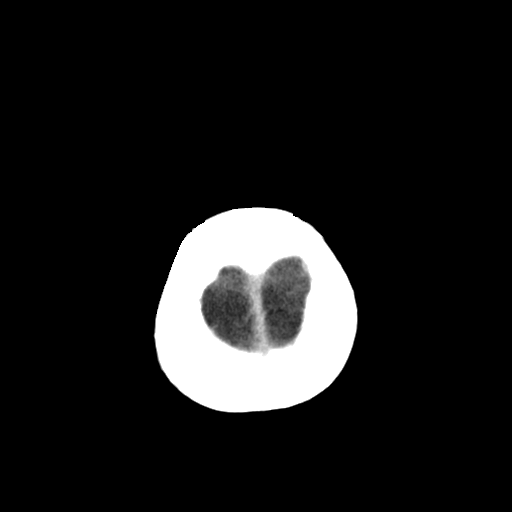

[Series 3: head bone · axial · 0.43mm/px · z∈[-139,-107]mm · 3 of 84 slices shown]
[im 9/84  bone]
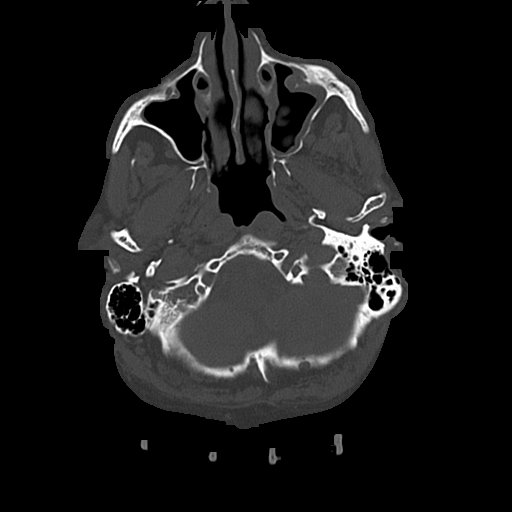
[im 17/84  bone]
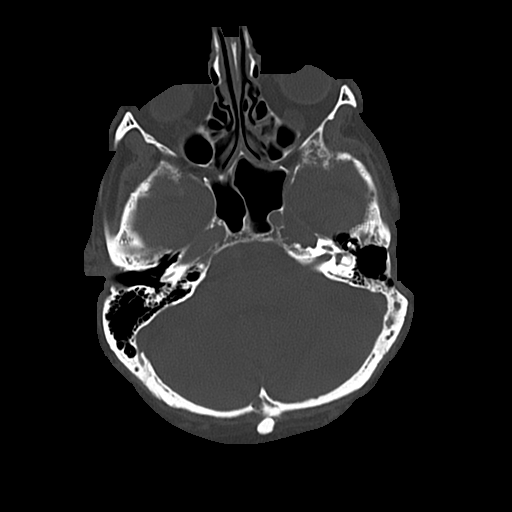
[im 25/84  bone]
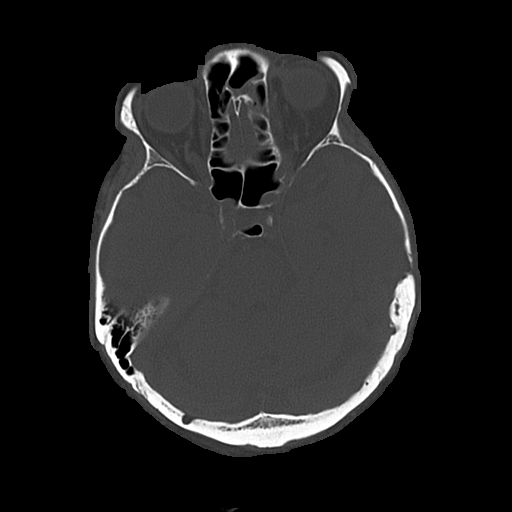

[Series 4: coronal soft · coronal · 0.34mm/px · 3 of 70 slices shown]
[im 24/70  brain]
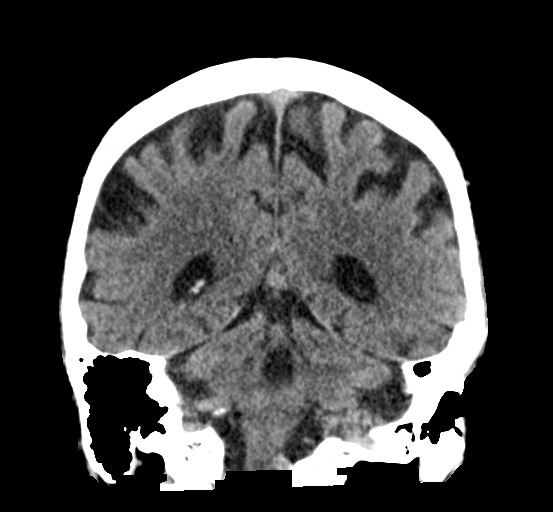
[im 31/70  brain]
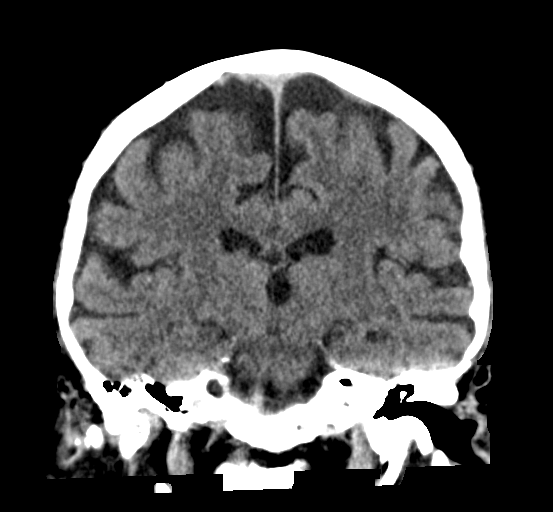
[im 39/70  brain]
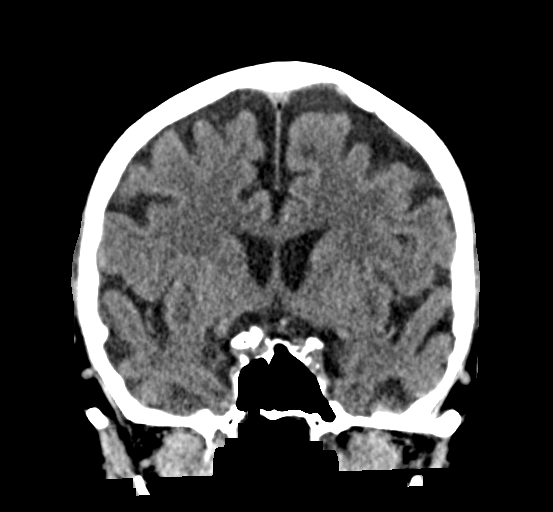

[Series 5: sagittal soft · sagittal · 0.34mm/px · 3 of 63 slices shown]
[im 21/63  brain]
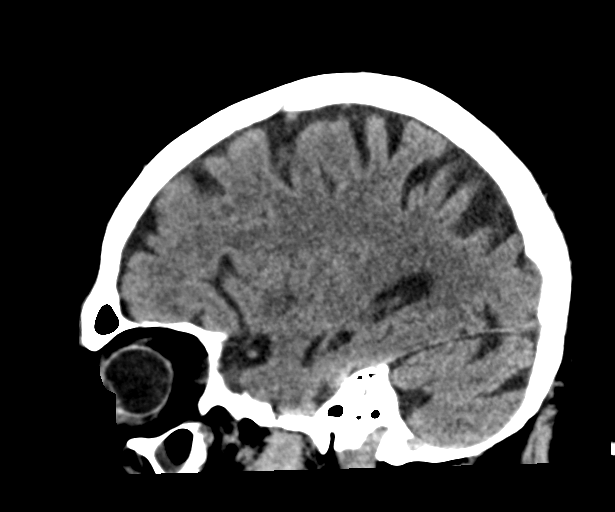
[im 32/63  brain]
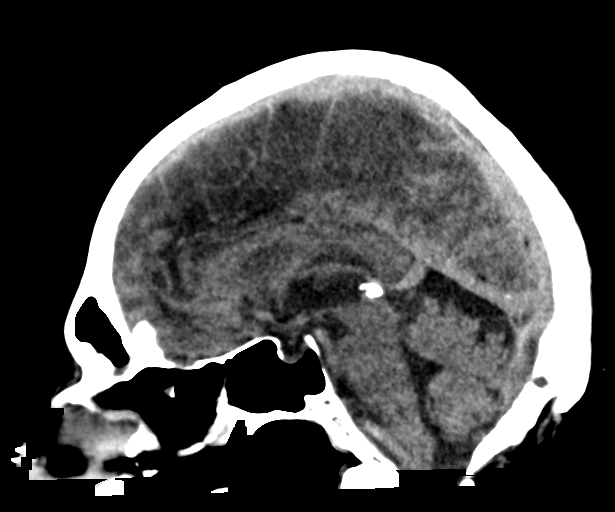
[im 42/63  brain]
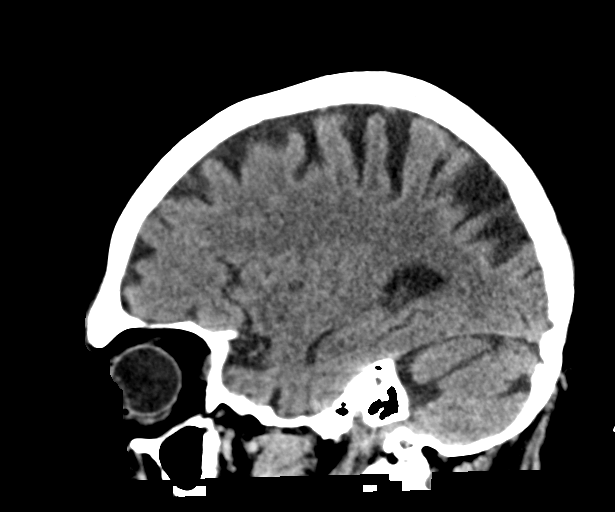

[16 of 47 positions shown; findings below may reference images not displayed]

FINDINGS: Brain: There is no evidence for acute hemorrhage, hydrocephalus,
mass lesion, or abnormal extra-axial fluid collection. No definite
CT evidence for acute infarction. Patchy low attenuation in the deep
hemispheric and periventricular white matter is nonspecific, but
likely reflects chronic microvascular ischemic demyelination.

Vascular: No hyperdense vessel or unexpected calcification.

Skull: No evidence for fracture. No worrisome lytic or sclerotic
lesion.

Sinuses/Orbits: Chronic mucosal disease noted paranasal sinuses.
Visualized portions of the globes and intraorbital fat are
unremarkable.

Other: None.
IMPRESSION: 1. No acute intracranial abnormality.
2. Chronic small vessel ischemic disease.
3. Chronic paranasal sinus mucosal disease.

## 2023-10-20 ENCOUNTER — Other Ambulatory Visit: Payer: Self-pay | Admitting: Cardiology

## 2023-10-20 DIAGNOSIS — E7800 Pure hypercholesterolemia, unspecified: Secondary | ICD-10-CM

## 2023-10-25 ENCOUNTER — Encounter: Payer: Self-pay | Admitting: Internal Medicine

## 2023-10-26 ENCOUNTER — Encounter: Payer: Self-pay | Admitting: Internal Medicine

## 2023-10-26 ENCOUNTER — Ambulatory Visit (INDEPENDENT_AMBULATORY_CARE_PROVIDER_SITE_OTHER): Admitting: Internal Medicine

## 2023-10-26 ENCOUNTER — Ambulatory Visit (INDEPENDENT_AMBULATORY_CARE_PROVIDER_SITE_OTHER)

## 2023-10-26 VITALS — BP 120/70 | HR 60 | Temp 98.4°F | Ht 70.0 in | Wt 168.0 lb

## 2023-10-26 DIAGNOSIS — E118 Type 2 diabetes mellitus with unspecified complications: Secondary | ICD-10-CM

## 2023-10-26 DIAGNOSIS — R058 Other specified cough: Secondary | ICD-10-CM

## 2023-10-26 DIAGNOSIS — Z7984 Long term (current) use of oral hypoglycemic drugs: Secondary | ICD-10-CM | POA: Diagnosis not present

## 2023-10-26 DIAGNOSIS — I4891 Unspecified atrial fibrillation: Secondary | ICD-10-CM

## 2023-10-26 MED ORDER — HYDROCODONE BIT-HOMATROP MBR 5-1.5 MG/5ML PO SOLN: 5.0000 mL | Freq: Four times a day (QID) | ORAL | 0 refills | Status: AC | PRN

## 2023-10-26 MED ORDER — LEVOFLOXACIN 500 MG PO TABS: 500.0000 mg | ORAL_TABLET | Freq: Every day | ORAL | 0 refills | Status: DC

## 2023-10-26 NOTE — Patient Instructions (Signed)
 Your testing for COVID, Flu and RSV are negative  Please take all new medication as prescribed - the antibiotic, cough medicine  Please continue all other medications as before, and refills have been done if requested.  Please have the pharmacy call with any other refills you may need.  Please keep your appointments with your specialists as you may have planned  Please go to the XRAY Department in the first floor for the x-ray testing  You will be contacted by phone if any changes need to be made immediately.  Otherwise, you will receive a letter about your results with an explanation, but please check with MyChart first.

## 2023-10-26 NOTE — Progress Notes (Signed)
 Patient ID: Vincent Howell, male   DOB: 12-06-1932, 89 y.o.   MRN: [id number]        Chief Complaint: follow up productive cough acute x 3 days       HPI:  Vincent Howell is a 89 y.o. male Here with acute onset mild to mod 2-3 days ST, HA, general weakness and malaise, with prod cough greenish sputum, but Pt denies chest pain, increased sob or doe, wheezing, orthopnea, PND, increased LE swelling, palpitations, dizziness or syncope.  Pt denies polydipsia, polyuria, or new focal neuro s/s.    Pt denies recent wt loss, night sweats, loss of appetite, or other constitutional symptoms         Wt Readings from Last 3 Encounters:  10/26/23 168 lb (76.2 kg)  08/18/23 168 lb (76.2 kg)  07/15/23 170 lb (77.1 kg)   BP Readings from Last 3 Encounters:  10/26/23 120/70  08/18/23 (!) 110/56  07/15/23 118/68         Past Medical History:  Diagnosis Date   CAD (coronary artery disease)    Diabetes mellitus without complication (HCC)    Hypercholesteremia    Hypertension    Macular degeneration    Pancreatitis    Prostate cancer (HCC)    Past Surgical History:  Procedure Laterality Date   APPENDECTOMY     BRAIN SURGERY     CHOLECYSTECTOMY     KNEE SURGERY     PROSTATECTOMY     RIGHT/LEFT HEART CATH AND CORONARY ANGIOGRAPHY N/A 08/07/2021   Procedure: RIGHT/LEFT HEART CATH AND CORONARY ANGIOGRAPHY;  Surgeon: Kyra Phy, MD;  Location[facility];  Service: Cardiovascular;  Laterality: N/A;   TONSILLECTOMY     TUMOR REMOVAL Right    KNEE    reports that he has quit smoking. His smoking use included cigarettes. He has never used smokeless tobacco. He reports current alcohol use. He reports that he does not use drugs. family history includes Heart attack in his brother. Allergies  Allergen Reactions   Aspirin  Anaphylaxis   Penicillins Anaphylaxis   Acetaminophen  Other (See Comments)    Unknown   2,4-D Dimethylamine Rash    Blisters   Benzoin Itching and Rash   Other Rash     Silk tape   Current Outpatient Medications on File Prior to Visit  Medication Sig Dispense Refill   amiodarone  (PACERONE ) 200 MG tablet TAKE 1/2 TABLET BY MOUTH DAILY 45 tablet 2   atorvastatin  (LIPITOR) 20 MG tablet TAKE 1 TABLET BY MOUTH EVERY DAY 90 tablet 3   Cholecalciferol (VITAMIN D) 50 MCG (2000 UT) CAPS Take 1 capsule by mouth daily.     ELIQUIS  5 MG TABS tablet TAKE 1 TABLET BY MOUTH TWICE A DAY 60 tablet 5   ezetimibe  (ZETIA ) 10 MG tablet TAKE 1 TABLET BY MOUTH EVERY DAY 90 tablet 2   fluticasone (VERAMYST) 27.5 MCG/SPRAY nasal spray Place 2 sprays into the nose daily.     glimepiride  (AMARYL ) 2 MG tablet TAKE 1 TABLET BY MOUTH EVERY DAY WITH BREAKFAST 90 tablet 2   losartan  (COZAAR ) 50 MG tablet TAKE 1 TABLET BY MOUTH EVERY DAY 90 tablet 2   metFORMIN  (GLUCOPHAGE -XR) 500 MG 24 hr tablet TAKE 3 TABLETS (1,500 MG TOTAL) BY MOUTH DAILY. RESUME ON 08/10/21. 270 tablet 3   Multiple Vitamins-Minerals (ICAPS AREDS 2 PO) Take 2 tablets by mouth daily.     nitroGLYCERIN  (NITROSTAT ) 0.4 MG SL tablet Place 1 tablet (0.4 mg total)  under the tongue every 5 (five) minutes x 3 doses as needed for chest pain. 25 tablet 3   pyridOXINE  (VITAMIN B-6) 100 MG tablet Take 100 mg by mouth daily.     vitamin B-12 (CYANOCOBALAMIN) 500 MCG tablet Take 500 mcg by mouth daily.     No current facility-administered medications on file prior to visit.        ROS:  All others reviewed and negative.  Objective        PE:  BP 120/70 (BP Location: Right Arm, Patient Position: Sitting, Cuff Size: Normal)   Pulse 60   Temp 98.4 F (36.9 C) (Oral)   Ht 5' 10 (1.778 m)   Wt 168 lb (76.2 kg)   SpO2 98%   BMI 24.11 kg/m                 Constitutional: Pt appears mild ill, fatigued               HENT: Head: NCAT.                Right Ear: External ear normal.                 Left Ear: External ear normal. Bilat tm's with mild erythema.  Max sinus areas non tender.  Pharynx with mild erythema, no exudate                Eyes: . Pupils are equal, round, and reactive to light. Conjunctivae and EOM are normal               Nose: without d/c or deformity               Neck: Neck supple. Gross normal ROM               Cardiovascular: Normal rate and regular rhythm.                 Pulmonary/Chest: Effort normal and breath sounds with few LLL rales, no wheezing.               Neurological: Pt is alert. At baseline orientation, motor grossly intact               Skin: Skin is warm. No rashes, no other new lesions, LE edema - none               Psychiatric: Pt behavior is normal without agitation   Micro: none  Cardiac tracings I have personally interpreted today:  none  Pertinent Radiological findings (summarize): none   Lab Results  Component Value Date   WBC 7.2 07/15/2023   HGB 13.9 07/15/2023   HCT 42.9 07/15/2023   PLT 233 07/15/2023   GLUCOSE 118 (H) 07/15/2023   CHOL 129 08/18/2023   TRIG 78.0 08/18/2023   HDL 55.50 08/18/2023   LDLCALC 57 08/18/2023   ALT 17 07/15/2023   AST 23 07/15/2023   NA 145 (H) 07/15/2023   K 4.7 07/15/2023   CL 107 (H) 07/15/2023   CREATININE 1.00 07/15/2023   BUN 19 07/15/2023   CO2 23 07/15/2023   TSH 10.500 (H) 07/15/2023   INR 1.0 06/29/2021   HGBA1C 6.8 (H) 08/18/2023   RSV - covid - Flu - all negative POCT  Assessment/Plan:  Vincent Howell is a 89 y.o. White or Caucasian [1] male with  has a past medical history of CAD (coronary artery disease), Diabetes mellitus without complication (HCC), Hypercholesteremia, Hypertension, Macular  degeneration, Pancreatitis, and Prostate cancer (HCC).  Atrial fibrillation with rapid ventricular response (HCC) RRR today, volume stable, cont current med tx  Productive cough Mild to mod acutely ill, high suspicion for LLL CAP, RSV/flu/covid are negative, for antibx course levaquin if can tolerate,  cough med prn, to f/u any worsening symptoms or concerns, for CXR today  Type 2 diabetes with complication  (HCC) Lab Results  Component Value Date   HGBA1C 6.8 (H) 08/18/2023   Stable, pt to continue current medical treatment glimeparide 2 mg every day, metformin  ER 500 mg - 3 qd   Followup: Return if symptoms worsen or fail to improve.  Rosalia Colonel, MD 10/27/2023 7:25 PM [facility] [place]

## 2023-10-27 ENCOUNTER — Encounter: Payer: Self-pay | Admitting: Internal Medicine

## 2023-10-27 ENCOUNTER — Ambulatory Visit: Payer: Self-pay | Admitting: Internal Medicine

## 2023-10-27 DIAGNOSIS — R058 Other specified cough: Secondary | ICD-10-CM | POA: Insufficient documentation

## 2023-10-27 MED ORDER — DOXYCYCLINE HYCLATE 100 MG PO TABS
100.0000 mg | ORAL_TABLET | Freq: Two times a day (BID) | ORAL | 0 refills | Status: DC
Start: 2023-10-27 — End: 2024-02-08

## 2023-10-27 NOTE — Assessment & Plan Note (Addendum)
 Mild to mod acutely ill, high suspicion for LLL CAP, RSV/flu/covid are negative, for antibx course levaquin if can tolerate,  cough med prn, to f/u any worsening symptoms or concerns, for CXR today

## 2023-10-27 NOTE — Assessment & Plan Note (Signed)
 RRR today, volume stable, cont current med tx

## 2023-10-27 NOTE — Assessment & Plan Note (Signed)
 Lab Results  Component Value Date   HGBA1C 6.8 (H) 08/18/2023   Stable, pt to continue current medical treatment glimeparide 2 mg every day, metformin  ER 500 mg - 3 qd

## 2023-11-15 ENCOUNTER — Encounter: Payer: Self-pay | Admitting: Cardiology

## 2023-12-03 ENCOUNTER — Telehealth: Payer: Self-pay | Admitting: Internal Medicine

## 2023-12-03 NOTE — Telephone Encounter (Signed)
 Copied from CRM [id number]. Topic: Clinical - Medical Advice >> Dec 03, 2023  1:20 PM Shereese L wrote: Reason for CRM: patient wants to receive a call back in reference to a letter received in the mail about the [facility] program

## 2023-12-10 ENCOUNTER — Telehealth: Payer: Self-pay

## 2023-12-10 NOTE — Telephone Encounter (Signed)
   Pre-operative Risk Assessment    Patient Name: Vincent Howell  DOB: September 04, 1932 MRN: [id number]   Date of last office visit: 07/15/23 CAMPBELL SHALLOW, MD Date of next office visit: 02/08/24 BRAIN CRENSHAW, MD   Request for Surgical Clearance    Procedure:  EYELID ENTROPION REPAIR RIGHT LOWER EYELID  Date of Surgery:  Clearance 12/22/23                                Surgeon:  DR REFUGIO NANNY Surgeon's Group or Practice Name:  [facility] Phone number:  [phone] Fax number:  [phone]   Type of Clearance Requested:   - Medical  - Pharmacy:  Hold Apixaban  (Eliquis )     Type of Anesthesia:  MAC   Additional requests/questions:    Signed, Vincent Howell   12/10/2023, 5:21 PM

## 2023-12-13 NOTE — Telephone Encounter (Signed)
   Name: Vincent Howell  DOB: 05-25-1932  MRN: [id number]  Primary Cardiologist: Redell Shallow, MD   Preoperative team, please contact this patient and set up a phone call appointment for further preoperative risk assessment. Please obtain consent and complete medication review. Thank you for your help.  I confirm that guidance regarding antiplatelet and oral anticoagulation therapy has been completed and, if necessary, noted below.  Patient has not had an Afib/aflutter ablation within the last 3 months or DCCV within the last 30 days     Per office protocol, patient can hold Eliquis  for 2 days prior to procedure.  I also confirmed the patient resides in the state of [place] . As per [facility] Medical Board telemedicine laws, the patient must reside in the state in which the provider is licensed.   Vincent CHRISTELLA Beauvais, NP 12/13/2023, 2:53 PM [facility]

## 2023-12-13 NOTE — Telephone Encounter (Signed)
 Patient with diagnosis of A Fib on Eliquis  for anticoagulation.    Procedure: EYELID ENTROPION REPAIR RIGHT LOWER EYELID  Date of procedure: 12/22/23   CHA2DS2-VASc Score = 5  This indicates a 7.2% annual risk of stroke. The patient's score is based upon: CHF History: 1 HTN History: 0 Diabetes History: 1 Stroke History: 0 Vascular Disease History: 1 Age Score: 2 Gender Score: 0   CrCl 54 ml/min Platelet count 233K  Patient has not had an Afib/aflutter ablation within the last 3 months or DCCV within the last 30 days   Per office protocol, patient can hold Eliquis  for 2 days prior to procedure.    **This guidance is not considered finalized until pre-operative APP has relayed final recommendations.**

## 2023-12-15 ENCOUNTER — Encounter: Payer: Self-pay | Admitting: *Deleted

## 2023-12-29 NOTE — Telephone Encounter (Addendum)
 Left message for surgeon office to call back to confirm if surgery clearance is still needed. Looks like surgery was 12/22/23.    I will fax these notes to surgeon office as well.

## 2023-12-30 ENCOUNTER — Telehealth: Payer: Self-pay | Admitting: *Deleted

## 2023-12-30 ENCOUNTER — Telehealth: Payer: Self-pay

## 2023-12-30 NOTE — Telephone Encounter (Signed)
 Patient has been scheduled

## 2023-12-30 NOTE — Telephone Encounter (Signed)
 Primary Cardiologist:Brian Pietro, MD   Preoperative team, please contact this patient and set up a phone call appointment for further preoperative risk assessment. Please obtain consent and complete medication review. Thank you for your help.   I confirm that guidance regarding antiplatelet and oral anticoagulation therapy has been completed and, if necessary, noted below.  Per office protocol, patient can hold Eliquis  for 2 days prior to procedure.   I also confirmed the patient resides in the state of [place] . As per [facility] Medical Board telemedicine laws, the patient must reside in the state in which the provider is licensed.   Rosaline EMERSON Bane, NP-C  12/30/2023, 2:01 PM [address] [place], [place] [address] Office [phone] Fax [phone]

## 2023-12-30 NOTE — Telephone Encounter (Signed)
 Patient has been scheduled med rec and consent done     Patient Consent for Virtual Visit         Vincent Howell has provided verbal consent on 12/30/2023 for a virtual visit (video or telephone).   CONSENT FOR VIRTUAL VISIT FOR:  Vincent Howell  By participating in this virtual visit I agree to the following:  I hereby voluntarily request, consent and authorize [facility] and its employed or contracted physicians, physician assistants, nurse practitioners or other licensed health care professionals (the Practitioner), to provide me with telemedicine health care services (the "Services) as deemed necessary by the treating Practitioner. I acknowledge and consent to receive the Services by the Practitioner via telemedicine. I understand that the telemedicine visit will involve communicating with the Practitioner through live audiovisual communication technology and the disclosure of certain medical information by electronic transmission. I acknowledge that I have been given the opportunity to request an in-person assessment or other available alternative prior to the telemedicine visit and am voluntarily participating in the telemedicine visit.  I understand that I have the right to withhold or withdraw my consent to the use of telemedicine in the course of my care at any time, without affecting my right to future care or treatment, and that the Practitioner or I may terminate the telemedicine visit at any time. I understand that I have the right to inspect all information obtained and/or recorded in the course of the telemedicine visit and may receive copies of available information for a reasonable fee.  I understand that some of the potential risks of receiving the Services via telemedicine include:  Delay or interruption in medical evaluation due to technological equipment failure or disruption; Information transmitted may not be sufficient (e.g. poor resolution of images) to allow  for appropriate medical decision making by the Practitioner; and/or  In rare instances, security protocols could fail, causing a breach of personal health information.  Furthermore, I acknowledge that it is my responsibility to provide information about my medical history, conditions and care that is complete and accurate to the best of my ability. I acknowledge that Practitioner's advice, recommendations, and/or decision may be based on factors not within their control, such as incomplete or inaccurate data provided by me or distortions of diagnostic images or specimens that may result from electronic transmissions. I understand that the practice of medicine is not an exact science and that Practitioner makes no warranties or guarantees regarding treatment outcomes. I acknowledge that a copy of this consent can be made available to me via my patient portal [facility] MyChart), or I can request a printed copy by calling the office of [facility].    I understand that my insurance will be billed for this visit.   I have read or had this consent read to me. I understand the contents of this consent, which adequately explains the benefits and risks of the Services being provided via telemedicine.  I have been provided ample opportunity to ask questions regarding this consent and the Services and have had my questions answered to my satisfaction. I give my informed consent for the services to be provided through the use of telemedicine in my medical care

## 2023-12-30 NOTE — Telephone Encounter (Signed)
 Tried contacting patient to schedule TELEVISIT no answer left a detailed vm to call back and schedule

## 2023-12-30 NOTE — Telephone Encounter (Signed)
   Pre-operative Risk Assessment    Patient Name: Vincent Howell  DOB: 1932-09-26 MRN: [id number]   Date of last office visit: 07/15/23 DR. CRENSHAW Date of next office visit: 02/08/24 DR. CRENSHAW   Request for Surgical Clearance    Procedure:  LEFT LOWER EYELID ENTROPION REPAIR  Date of Surgery:  Clearance 02/02/24                                Surgeon:  DR. TAL RUBINSTEIN Surgeon's Group or Practice Name:  [facility]  Phone number:  [phone] Fax number:  [phone]   Type of Clearance Requested:   - Medical  - Pharmacy:  Hold Apixaban  (Eliquis )     Type of Anesthesia:  MAC   Additional requests/questions:    [name]   12/30/2023, 11:52 AM

## 2024-01-06 ENCOUNTER — Telehealth: Payer: Self-pay

## 2024-01-06 NOTE — Telephone Encounter (Signed)
 Copied from CRM #[id number]. Topic: Appointments - Scheduling Inquiry for Clinic >> Jan 06, 2024  1:05 PM Vincent Howell wrote: Reason for CRM: Patient is an existing patient of Dr. Rollene and he called in to see if he and his wife Vincent Howell can be accepted as toc/new patients with Dr. Geofm. Patient stated his wife had an appointment with Dr. Geofm once and she really took to her bedside manner. Patient is requesting if it is at all possible for Dr. Geofm to accept them both as new patients.  [phone] (H)

## 2024-01-07 NOTE — Telephone Encounter (Signed)
 Fine with me

## 2024-01-09 ENCOUNTER — Other Ambulatory Visit: Payer: Self-pay | Admitting: Cardiology

## 2024-01-09 DIAGNOSIS — E7800 Pure hypercholesterolemia, unspecified: Secondary | ICD-10-CM

## 2024-01-11 ENCOUNTER — Telehealth: Payer: Self-pay

## 2024-01-11 NOTE — Progress Notes (Unsigned)
 Virtual Visit via Telephone Note   Because of KINSLER SOEDER co-morbid illnesses, he is at least at moderate risk for complications without adequate follow up.  This format is felt to be most appropriate for this patient at this time.  Due to technical limitations with video connection Web designer), today's appointment will be conducted as an audio only telehealth visit, and ONIEL MELESKI verbally agreed to proceed in this manner.   All issues noted in this document were discussed and addressed.  No physical exam could be performed with this format.  Evaluation Performed:  Preoperative cardiovascular risk assessment _____________   Date:  01/11/2024   Patient ID:  Vincent Howell, DOB [birth year], MRN [id number] Patient Location:  Home Provider location:   Office  Primary Care Provider:  Rollene Almarie LABOR, MD Primary Cardiologist:  Redell Shallow, MD  Chief Complaint / Patient Profile   89 y.o. y/o male with a h/o atrial fibrillation, coronary artery disease, hyperlipidemia who is pending left lower eyelid entropion repair and presents today for telephonic preoperative cardiovascular risk assessment.  History of Present Illness    Vincent Howell is a 89 y.o. male who presents via audio/video conferencing for a telehealth visit today.  Pt was last seen in cardiology clinic on 07/15/2023 by Dr. Shallow.  At that time MAYAN KLOEPFER was doing well .  The patient is now pending procedure as outlined above. Since his last visit, he he continues to be stable from a cardiac standpoint.  Today he denies chest pain, shortness of breath, lower extremity edema, fatigue, palpitations, melena, hematuria, hemoptysis, diaphoresis, weakness, presyncope, syncope, orthopnea, and PND.   Past Medical History    Past Medical History:  Diagnosis Date   CAD (coronary artery disease)    Diabetes mellitus without complication (HCC)    Hypercholesteremia    Hypertension    Macular degeneration     Pancreatitis    Prostate cancer (HCC)    Past Surgical History:  Procedure Laterality Date   APPENDECTOMY     BRAIN SURGERY     CHOLECYSTECTOMY     KNEE SURGERY     PROSTATECTOMY     RIGHT/LEFT HEART CATH AND CORONARY ANGIOGRAPHY N/A 08/07/2021   Procedure: RIGHT/LEFT HEART CATH AND CORONARY ANGIOGRAPHY;  Surgeon: Wendel Lurena POUR, MD;  Location: [facility];  Service: Cardiovascular;  Laterality: N/A;   TONSILLECTOMY     TUMOR REMOVAL Right    KNEE    Allergies  Allergies  Allergen Reactions   Aspirin  Anaphylaxis   Penicillins Anaphylaxis   Acetaminophen  Other (See Comments)    Unknown   2,4-D Dimethylamine Rash    Blisters   Benzoin Itching and Rash   Other Rash    Silk tape    Home Medications    Prior to Admission medications   Medication Sig Start Date End Date Taking? Authorizing Provider  amiodarone  (PACERONE ) 200 MG tablet TAKE 1/2 TABLET BY MOUTH DAILY 07/08/23   Shallow Redell RAMAN, MD  atorvastatin  (LIPITOR) 20 MG tablet TAKE 1 TABLET BY MOUTH EVERY DAY 01/18/23   Shallow Redell RAMAN, MD  Cholecalciferol (VITAMIN D) 50 MCG (2000 UT) CAPS Take 1 capsule by mouth daily.    [provider]  doxycycline  (VIBRA -TABS) 100 MG tablet Take 1 tablet (100 mg total) by mouth 2 (two) times daily. 10/27/23   Norleen Lynwood ORN, MD  ELIQUIS  5 MG TABS tablet TAKE 1 TABLET BY MOUTH TWICE A DAY 09/08/23   Shallow Redell  S, MD  ezetimibe  (ZETIA ) 10 MG tablet TAKE 1 TABLET BY MOUTH EVERY DAY 10/22/23   Pietro Redell RAMAN, MD  fluticasone (VERAMYST) 27.5 MCG/SPRAY nasal spray Place 2 sprays into the nose daily.    [provider]  glimepiride  (AMARYL ) 2 MG tablet TAKE 1 TABLET BY MOUTH EVERY DAY WITH BREAKFAST 09/28/23   Rollene Almarie LABOR, MD  levofloxacin  (LEVAQUIN ) 500 MG tablet Take 1 tablet (500 mg total) by mouth daily. 10/26/23   Norleen Lynwood ORN, MD  losartan  (COZAAR ) 50 MG tablet TAKE 1 TABLET BY MOUTH EVERY DAY 07/08/23   Pietro Redell RAMAN, MD  metFORMIN   (GLUCOPHAGE -XR) 500 MG 24 hr tablet TAKE 3 TABLETS (1,500 MG TOTAL) BY MOUTH DAILY. RESUME ON 08/10/21. 03/29/23   Rollene Almarie LABOR, MD  Multiple Vitamins-Minerals (ICAPS AREDS 2 PO) Take 2 tablets by mouth daily.    [provider]  nitroGLYCERIN  (NITROSTAT ) 0.4 MG SL tablet Place 1 tablet (0.4 mg total) under the tongue every 5 (five) minutes x 3 doses as needed for chest pain. 08/08/21   Duke, Jon Garre, PA  pyridOXINE  (VITAMIN B-6) 100 MG tablet Take 100 mg by mouth daily.    [provider]  vitamin B-12 (CYANOCOBALAMIN) 500 MCG tablet Take 500 mcg by mouth daily.    [provider]    Physical Exam    Vital Signs:  KIEGAN MACARAEG does not have vital signs available for review today.  Given telephonic nature of communication, physical exam is limited. AAOx3. NAD. Normal affect.  Speech and respirations are unlabored.  Accessory Clinical Findings    None  Assessment & Plan    1.  Preoperative Cardiovascular Risk Assessment: LEFT LOWER EYELID ENTROPION REPAIR   Date of Surgery:  Clearance 02/02/24                                  Surgeon:  DR. TAL RUBINSTEIN Surgeon's Group or Practice Name:  [facility]  Phone number:  [phone] Fax number:  [phone]    Primary Cardiologist: Redell Pietro, MD  Chart reviewed as part of pre-operative protocol coverage. Given past medical history and time since last visit, based on ACC/AHA guidelines, MONTEL VANDERHOOF would be at acceptable risk for the planned procedure without further cardiovascular testing.   Patient was advised that if he develops new symptoms prior to surgery to contact our office to arrange a follow-up appointment.  He verbalized understanding.  Per office protocol, patient can hold Eliquis  for 2 days prior to procedure.   I will route this recommendation to the requesting party via Epic fax function and remove from pre-op pool.       Time:   Today, I have spent 5  minutes with the patient with telehealth technology discussing medical history, symptoms, and management plan.  I spent 10 minutes reviewing patient's past cardiac history and cardiac medications.    Josefa CHRISTELLA Beauvais, NP  01/11/2024, 12:36 PM

## 2024-01-11 NOTE — Telephone Encounter (Signed)
 Copied from CRM #[id number]. Topic: Appointments - Scheduling Inquiry for Clinic >> Jan 06, 2024  1:05 PM Vincent Howell wrote: Reason for CRM: Patient is an existing patient of Dr. Rollene and he called in to see if he and his wife [name] can be accepted as toc/new patients with Dr. Geofm. Patient stated his wife had an appointment with Dr. Geofm once and she really took to her bedside manner. Patient is requesting if it is at all possible for Dr. Geofm to accept them both as new patients.  [phone] (H) >> Jan 11, 2024 10:24 AM Rosina BIRCH wrote: Patient called stating he is checking on a previous message about a transfer [phone]

## 2024-01-11 NOTE — Telephone Encounter (Signed)
 I am not currently taking new pateints

## 2024-01-11 NOTE — Telephone Encounter (Signed)
 Called and informed patient husband that dr burns is currently not taking on new patient and pt verbalized he understood

## 2024-01-12 ENCOUNTER — Ambulatory Visit: Attending: Cardiovascular Disease

## 2024-01-12 DIAGNOSIS — Z01810 Encounter for preprocedural cardiovascular examination: Secondary | ICD-10-CM | POA: Diagnosis not present

## 2024-02-01 NOTE — Progress Notes (Signed)
 HPI: FU atrial fibrillation, syncope and FU coronary artery disease.  Patient apparently had angioplasty of unknown vessel at [facility] in the 1980s.  Patient admitted from the office March 2023 following syncopal episode.  Cardiac catheterization revealed 90% mid LAD, 60% proximal LAD and 80% third posterior lateral. Given that patient was not having chest pain he has been treated medically.  During that admission the patient developed atrial fibrillation with rapid ventricular response.  He was treated with IV amiodarone  and converted to sinus rhythm.  He was discharged with an outpatient monitor which was performed March 2023 and showed sinus rhythm with PACs, PAT, paroxysmal atrial fibrillation and 7 beats nonsustained ventricular tachycardia.  Echocardiogram September 2023 showed ejection fraction 50 to 55%, mild left ventricular enlargement, mild left ventricular hypertrophy, moderate left atrial enlargement, mild mitral regurgitation, mild aortic insufficiency.  Since last seen patient denies dyspnea, chest pain, palpitations or syncope.  He has an unsteady gait.  He had some bleeding from his ankle.  Current Outpatient Medications  Medication Sig Dispense Refill   amiodarone  (PACERONE ) 200 MG tablet TAKE 1/2 TABLET BY MOUTH DAILY 45 tablet 2   atorvastatin  (LIPITOR) 20 MG tablet TAKE 1 TABLET BY MOUTH EVERY DAY 90 tablet 1   Cholecalciferol (VITAMIN D) 50 MCG (2000 UT) CAPS Take 1 capsule by mouth daily.     ELIQUIS  5 MG TABS tablet TAKE 1 TABLET BY MOUTH TWICE A DAY 60 tablet 5   ezetimibe  (ZETIA ) 10 MG tablet TAKE 1 TABLET BY MOUTH EVERY DAY 90 tablet 2   fluticasone (VERAMYST) 27.5 MCG/SPRAY nasal spray Place 2 sprays into the nose daily.     glimepiride  (AMARYL ) 2 MG tablet TAKE 1 TABLET BY MOUTH EVERY DAY WITH BREAKFAST 90 tablet 2   losartan  (COZAAR ) 50 MG tablet TAKE 1 TABLET BY MOUTH EVERY DAY 90 tablet 2   metFORMIN  (GLUCOPHAGE -XR) 500 MG 24 hr tablet TAKE 3 TABLETS (1,500 MG  TOTAL) BY MOUTH DAILY. RESUME ON 08/10/21. 270 tablet 3   Multiple Vitamins-Minerals (ICAPS AREDS 2 PO) Take 2 tablets by mouth daily.     nitroGLYCERIN  (NITROSTAT ) 0.4 MG SL tablet Place 1 tablet (0.4 mg total) under the tongue every 5 (five) minutes x 3 doses as needed for chest pain. 25 tablet 3   pyridOXINE  (VITAMIN B-6) 100 MG tablet Take 100 mg by mouth daily.     vitamin B-12 (CYANOCOBALAMIN) 500 MCG tablet Take 500 mcg by mouth daily.     No current facility-administered medications for this visit.     Past Medical History:  Diagnosis Date   CAD (coronary artery disease)    Diabetes mellitus without complication (HCC)    Hypercholesteremia    Hypertension    Macular degeneration    Pancreatitis    Prostate cancer (HCC)     Past Surgical History:  Procedure Laterality Date   APPENDECTOMY     BRAIN SURGERY     CHOLECYSTECTOMY     KNEE SURGERY     PROSTATECTOMY     RIGHT/LEFT HEART CATH AND CORONARY ANGIOGRAPHY N/A 08/07/2021   Procedure: RIGHT/LEFT HEART CATH AND CORONARY ANGIOGRAPHY;  Surgeon: Wendel Lurena POUR, MD;  Location: [facility];  Service: Cardiovascular;  Laterality: N/A;   TONSILLECTOMY     TUMOR REMOVAL Right    KNEE    Social History   Socioeconomic History   Marital status: Married    Spouse name: Lauraine   Number of children: 2   Years  of education: Not on file   Highest education level: Associate degree: occupational, Scientist, product/process development, or vocational program  Occupational History   Occupation: RETIRED  Tobacco Use   Smoking status: Former    Types: Cigarettes   Smokeless tobacco: Never  Vaping Use   Vaping status: Never Used  Substance and Sexual Activity   Alcohol use: Yes    Comment: Occasional   Drug use: Never   Sexual activity: Not on file  Other Topics Concern   Not on file  Social History Narrative   Lives with wife.   Social Drivers of Corporate investment banker Strain: Low Risk  (10/25/2023)   Overall Financial Resource Strain  (CARDIA)    Difficulty of Paying Living Expenses: Not hard at all  Food Insecurity: No Food Insecurity (10/25/2023)   Hunger Vital Sign    Worried About Running Out of Food in the Last Year: Never true    Ran Out of Food in the Last Year: Never true  Transportation Needs: No Transportation Needs (10/25/2023)   PRAPARE - Administrator, Civil Service (Medical): No    Lack of Transportation (Non-Medical): No  Physical Activity: Insufficiently Active (10/25/2023)   Exercise Vital Sign    Days of Exercise per Week: 4 days    Minutes of Exercise per Session: 30 min  Stress: No Stress Concern Present (10/25/2023)   [employer] of Occupational Health - Occupational Stress Questionnaire    Feeling of Stress: Only a little  Social Connections: Socially Isolated (10/25/2023)   Social Connection and Isolation Panel    Frequency of Communication with Friends and Family: Once a week    Frequency of Social Gatherings with Friends and Family: Once a week    Attends Religious Services: Patient declined    Database administrator or Organizations: No    Attends Engineer, structural: Not on file    Marital Status: Married  Catering manager Violence: Not At Risk (06/08/2023)   Humiliation, Afraid, Rape, and Kick questionnaire    Fear of Current or Ex-Partner: No    Emotionally Abused: No    Physically Abused: No    Sexually Abused: No    Family History  Problem Relation Age of Onset   Heart attack Brother     ROS: no fevers or chills, productive cough, hemoptysis, dysphasia, odynophagia, melena, hematochezia, dysuria, hematuria, rash, seizure activity, orthopnea, PND, pedal edema, claudication. Remaining systems are negative.  Physical Exam: Well-developed well-nourished in no acute distress.  Skin is warm and dry.  HEENT is normal.  Neck is supple.  Chest is clear to auscultation with normal expansion.  Cardiovascular exam is regular rate and rhythm.  Abdominal exam  nontender or distended. No masses palpated. Extremities show no edema. neuro grossly intact  ECG- personally reviewed  A/P  1 paroxysmal atrial fibrillation-patient remains in sinus rhythm on exam.  Continue amiodarone  and apixaban .  Check hemoglobin, renal function, liver functions, TSH and chest x-ray.  He has fallen in the past and recently had some bleeding from an area from his ankle.  We discussed watchman today and he would be interested in pursuing.  Will arrange evaluation.  [facility] Referral for Left Atrial Appendage Closure with Non-Valvular Atrial Fibrillation   Vincent Howell is a 89 y.o. male is being referred to the [facility] Team for evaluation for Left Atrial Appendage Closure with Watchman device for the management of stroke risk resulting form non-valvular atrial fibrillation.  Base upon Mr. Cerreta history, he is felt to be a poor candidate for long-term anticoagulation because of a high risk of recurrent falls.  The patient has a HAS-BLED score of 4 indicating a Yearly Major Bleeding Risk of 8.7%.   His CHADS2-VASc Score is 5 with an unadjusted Ischemic Stroke Rate (% per year) of 7.2%.    His stroke risk necessitates a strategy of stroke prevention with either long-term oral anticoagulation or left atrial appendage occlusion therapy. We have discussed their bleeding risk in the context of their comorbid medical problems, as well as the rationale for referral for evaluation of Watchman left atrial appendage occlusion therapy. While the patient is at high long-term bleeding risk, they may be appropriate for short-term anticoagulation. Based on this individual patient's stroke and bleeding risk, a shared decision has been made to refer the patient for consideration of Watchman left atrial appendage closure utilizing the [employer] of Cardiology shared decision tool.   2 cardiomyopathy-combined ischemic/nonischemic.  LV function  has improved on most recent study.  Continue ARB.  Beta-blocker discontinued previously secondary to bradycardia.  3 coronary artery disease-continue statin.  He denies chest pain.  4 hyperlipidemia-continue statin.  5 hypertension-patient's blood pressure is controlled.  Continue present medical regimen.  6 history of bradycardia-will continue low-dose amiodarone  to help maintain sinus rhythm.  Beta-blocker discontinued previously.  Follow.  Redell Shallow, MD

## 2024-02-08 ENCOUNTER — Encounter: Payer: Self-pay | Admitting: Cardiology

## 2024-02-08 ENCOUNTER — Ambulatory Visit: Admitting: Cardiology

## 2024-02-08 ENCOUNTER — Ambulatory Visit (HOSPITAL_COMMUNITY)
Admission: RE | Admit: 2024-02-08 | Discharge: 2024-02-08 | Disposition: A | Discharge status: Home / Self Care | Source: Ambulatory Visit | Attending: Cardiology | Admitting: Cardiology

## 2024-02-08 VITALS — BP 146/58 | HR 49 | Ht 70.0 in | Wt 176.4 lb

## 2024-02-08 DIAGNOSIS — E7800 Pure hypercholesterolemia, unspecified: Secondary | ICD-10-CM

## 2024-02-08 DIAGNOSIS — I2510 Atherosclerotic heart disease of native coronary artery without angina pectoris: Secondary | ICD-10-CM

## 2024-02-08 DIAGNOSIS — I10 Essential (primary) hypertension: Secondary | ICD-10-CM

## 2024-02-08 DIAGNOSIS — Z79899 Other long term (current) drug therapy: Secondary | ICD-10-CM | POA: Insufficient documentation

## 2024-02-08 DIAGNOSIS — I480 Paroxysmal atrial fibrillation: Secondary | ICD-10-CM

## 2024-02-08 LAB — CBC

## 2024-02-08 NOTE — Patient Instructions (Addendum)
 Medication Instructions:  Your physician recommends that you continue on your current medications as directed. Please refer to the Current Medication list given to you today.  *If you need a refill on your cardiac medications before your next appointment, please call your pharmacy*  Lab Work: A1C, CBC, BMET, Liver panel -- Today  You may go to any [facility] Location for your lab work:  [facility] - [address] ([facility]) - [address] [phone] [address]  [place] - [address]   [place]  - [address]   [place] - [address] - [address] [employer]  - [address] [place] - [address] ([facility]   If you have labs (blood work) drawn today and your tests are completely normal, you will receive your results only by: [employer] (if you have MyChart)  If you have any lab test that is abnormal or we need to change your treatment, we will call you or send a MyChart message to review the results.  Testing/Procedures: Go down stairs for chest xray.  Follow-Up: At [facility], you and your health needs are our priority.  As part of our continuing mission to provide you with exceptional heart care, we have created designated Provider Care Teams.  These Care Teams include your primary Cardiologist (physician) and Advanced Practice Providers (APPs -  Physician Assistants and Nurse Practitioners) who all work together to provide you with the care you need, when you need it.  Your next appointment:   6 month You are being referred for a a watchmen. You will get a call to schedule that.  The format for your next appointment:   In Person  Provider:   Redell Shallow, MD

## 2024-02-09 ENCOUNTER — Ambulatory Visit: Payer: Self-pay | Admitting: Cardiology

## 2024-02-09 LAB — BASIC METABOLIC PANEL WITH GFR
BUN/Creatinine Ratio: 16 (ref 10–24)
BUN: 17 mg/dL (ref 8–27)
CO2: 22 mmol/L (ref 20–29)
Calcium: 9.2 mg/dL (ref 8.6–10.2)
Chloride: 100 mmol/L (ref 96–106)
Creatinine, Ser: 1.04 mg/dL (ref 0.76–1.27)
Glucose: 325 mg/dL — ABNORMAL HIGH (ref 70–99)
Potassium: 4.4 mmol/L (ref 3.5–5.2)
Sodium: 137 mmol/L (ref 134–144)
eGFR: 69 mL/min/1.73 (ref >59)

## 2024-02-09 LAB — HEPATIC FUNCTION PANEL
ALT: 17 IU/L (ref 0–44)
AST: 23 IU/L (ref 0–40)
Albumin: 4.2 g/dL (ref 3.7–4.7)
Alkaline Phosphatase: 101 IU/L (ref 48–129)
Bilirubin Total: 0.6 mg/dL (ref 0.0–1.2)
Bilirubin, Direct: 0.26 mg/dL (ref 0.00–0.40)
Total Protein: 6.3 g/dL (ref 6.0–8.5)

## 2024-02-09 LAB — CBC
Hematocrit: 38.7 % (ref 37.5–51.0)
Hemoglobin: 12.3 g/dL — AB (ref 13.0–17.7)
MCH: 30.3 pg (ref 26.6–33.0)
MCHC: 31.8 g/dL (ref 31.5–35.7)
MCV: 95 fL (ref 79–97)
Platelets: 219 x10E3/uL (ref 150–450)
RBC: 4.06 x10E6/uL — AB (ref 4.14–5.80)
RDW: 13.4 % (ref 11.6–15.4)
WBC: 6.4 x10E3/uL (ref 3.4–10.8)

## 2024-02-09 LAB — HEMOGLOBIN A1C
Est. average glucose Bld gHb Est-mCnc: 131 mg/dL
Hgb A1c MFr Bld: 6.2 % — AB (ref 4.8–5.6)

## 2024-02-15 ENCOUNTER — Ambulatory Visit: Admitting: Internal Medicine

## 2024-02-22 ENCOUNTER — Encounter: Payer: Self-pay | Admitting: Cardiology

## 2024-02-28 ENCOUNTER — Ambulatory Visit: Payer: Self-pay | Admitting: Internal Medicine

## 2024-02-28 ENCOUNTER — Ambulatory Visit (INDEPENDENT_AMBULATORY_CARE_PROVIDER_SITE_OTHER): Admitting: Internal Medicine

## 2024-02-28 VITALS — BP 110/60 | HR 50 | Temp 97.7°F | Ht 70.0 in | Wt 172.0 lb

## 2024-02-28 DIAGNOSIS — E118 Type 2 diabetes mellitus with unspecified complications: Secondary | ICD-10-CM | POA: Diagnosis not present

## 2024-02-28 DIAGNOSIS — E1169 Type 2 diabetes mellitus with other specified complication: Secondary | ICD-10-CM

## 2024-02-28 DIAGNOSIS — E785 Hyperlipidemia, unspecified: Secondary | ICD-10-CM | POA: Diagnosis not present

## 2024-02-28 DIAGNOSIS — I4819 Other persistent atrial fibrillation: Secondary | ICD-10-CM

## 2024-02-28 LAB — COMPREHENSIVE METABOLIC PANEL WITH GFR
ALT: 15 U/L (ref 0–53)
AST: 22 U/L (ref 0–37)
Albumin: 4.2 g/dL (ref 3.5–5.2)
Alkaline Phosphatase: 81 U/L (ref 39–117)
BUN: 23 mg/dL (ref 6–23)
CO2: 25 meq/L (ref 19–32)
Calcium: 9 mg/dL (ref 8.4–10.5)
Chloride: 105 meq/L (ref 96–112)
Creatinine, Ser: 1.24 mg/dL (ref 0.40–1.50)
GFR: 51.62 mL/min — ABNORMAL LOW (ref >60.00)
Glucose, Bld: 99 mg/dL (ref 70–99)
Potassium: 4.5 meq/L (ref 3.5–5.1)
Sodium: 140 meq/L (ref 135–145)
Total Bilirubin: 0.7 mg/dL (ref 0.2–1.2)
Total Protein: 7 g/dL (ref 6.0–8.3)

## 2024-02-28 LAB — LIPID PANEL
Cholesterol: 113 mg/dL (ref 0–200)
HDL: 50.7 mg/dL (ref >39.00)
LDL Cholesterol: 46 mg/dL (ref 0–99)
NonHDL: 61.87
Total CHOL/HDL Ratio: 2
Triglycerides: 79 mg/dL (ref 0.0–149.0)
VLDL: 15.8 mg/dL (ref 0.0–40.0)

## 2024-02-28 NOTE — Progress Notes (Signed)
   Subjective:   Patient ID: Vincent Howell, male    DOB: 10-08-1932, 89 y.o.   MRN: [id number]  Discussed the use of AI scribe software for clinical note transcription with the patient, who gave verbal consent to proceed. History of Present Illness Vincent Howell is an 89 year old male with atrial fibrillation who presents with bleeding issues related to blood thinner use.  He experienced a significant bleeding episode from his ankle approximately one month ago, which was difficult to stop. He is currently on Eliquis  and reports that he bleeds and bruises easily. During the episode, emergency services were called, and a medic was able to stop the bleeding. He has since learned how to manage the bleeding himself, which was beneficial when the same issue occurred the following night. He expresses fear about not feeling bleeding if it were to occur at night and recalls Dr. Pietro warning him about the risk of internal bleeding if he were to fall and hit his head.  He mentions a recent blood sugar test that showed an A1c of 6.2, but a blood sugar level of 300, which he attributes to something he ate that morning. He is due for a recheck of his blood sugar and cholesterol levels.  Review of Systems  Constitutional: Negative.   HENT: Negative.    Eyes: Negative.   Respiratory:  Negative for cough, chest tightness and shortness of breath.   Cardiovascular:  Negative for chest pain, palpitations and leg swelling.  Gastrointestinal:  Negative for abdominal distention, abdominal pain, constipation, diarrhea, nausea and vomiting.  Musculoskeletal: Negative.   Skin: Negative.   Neurological: Negative.   Psychiatric/Behavioral: Negative.      Objective:  Physical Exam Constitutional:      Appearance: He is well-developed.  HENT:     Head: Normocephalic and atraumatic.  Cardiovascular:     Rate and Rhythm: Normal rate and regular rhythm.  Pulmonary:     Effort: Pulmonary effort is normal. No  respiratory distress.     Breath sounds: Normal breath sounds. No wheezing or rales.  Abdominal:     General: Bowel sounds are normal. There is no distension.     Palpations: Abdomen is soft.     Tenderness: There is no abdominal tenderness.  Musculoskeletal:     Cervical back: Normal range of motion.  Skin:    General: Skin is warm and dry.     Comments: Foot exam done  Neurological:     Mental Status: He is alert and oriented to person, place, and time.     Coordination: Coordination normal.    Vitals:   02/28/24 0940  BP: 110/60  Pulse: (!) 50  Temp: 97.7 F (36.5 C)  TempSrc: Oral  SpO2: 97%  Weight: 172 lb (78 kg)  Height: 5' 10 (1.778 m)    Assessment and Plan Assessment & Plan Atrial fibrillation with anticoagulation-associated bleeding risk   He experienced significant bleeding likely due to Eliquis , raising concerns about bleeding risk from anticoagulation, especially with potential for internal bleeding from falls. Discuss the Watchman device with a cardiologist to evaluate eligibility and potential discontinuation of Eliquis .  Type 2 diabetes mellitus   His A1c is at 6.2, indicating good control, but random blood sugar is elevated at 300 mg/dL, possibly due to dietary intake. Recheck blood sugar level with CMP.  Hyperlipemia associated with type 2 diabetes   There is a need for lipid panel testing to monitor hyperlipemia. Order lipid panel.

## 2024-02-28 NOTE — Assessment & Plan Note (Signed)
 This is persistent and on amiodarone  and eliquis . He experienced significant bleeding likely due to Eliquis , raising concerns about bleeding risk from anticoagulation, especially with potential for internal bleeding from falls. Discuss the Watchman device with a cardiologist to evaluate eligibility and potential discontinuation of Eliquis .

## 2024-02-28 NOTE — Assessment & Plan Note (Signed)
 His A1c is at 6.2, indicating good control, but random blood sugar is elevated at 300 mg/dL, possibly due to dietary intake. Recheck blood sugar level with CMP. Continue metformin  1500 mg daily and amaryl  2 mg daily. Is on ARB and statin.

## 2024-02-28 NOTE — Assessment & Plan Note (Signed)
 There is a need for lipid panel testing to monitor hyperlipemia. Order lipid panel.

## 2024-02-28 NOTE — Patient Instructions (Signed)
 We will check the labs today.

## 2024-03-08 ENCOUNTER — Other Ambulatory Visit: Payer: Self-pay | Admitting: Internal Medicine

## 2024-03-13 ENCOUNTER — Other Ambulatory Visit: Payer: Self-pay | Admitting: Cardiology

## 2024-03-13 ENCOUNTER — Encounter: Payer: Self-pay | Admitting: Cardiology

## 2024-03-13 ENCOUNTER — Encounter: Payer: Self-pay | Admitting: Radiology

## 2024-03-13 DIAGNOSIS — I4891 Unspecified atrial fibrillation: Secondary | ICD-10-CM

## 2024-03-13 NOTE — Telephone Encounter (Signed)
 Prescription refill request for Eliquis  received. Indication:afib Last office visit:9/25 Scr:1.24  10/25 Age: 89 Weight:78  kg  Prescription refilled

## 2024-03-24 ENCOUNTER — Encounter: Payer: Self-pay | Admitting: Cardiology

## 2024-03-24 ENCOUNTER — Ambulatory Visit: Attending: Cardiology | Admitting: Cardiology

## 2024-03-24 VITALS — BP 136/70 | HR 54 | Ht 70.0 in | Wt 177.4 lb

## 2024-03-24 DIAGNOSIS — I480 Paroxysmal atrial fibrillation: Secondary | ICD-10-CM | POA: Diagnosis present

## 2024-03-24 DIAGNOSIS — D6869 Other thrombophilia: Secondary | ICD-10-CM | POA: Diagnosis present

## 2024-03-24 DIAGNOSIS — R296 Repeated falls: Secondary | ICD-10-CM | POA: Insufficient documentation

## 2024-03-24 DIAGNOSIS — I4891 Unspecified atrial fibrillation: Secondary | ICD-10-CM

## 2024-03-24 NOTE — Progress Notes (Signed)
 Electrophysiology Office Note:   Date:  03/25/2024  ID:  Vincent Howell, DOB 27-Aug-1932, MRN [id number]  Primary Cardiologist: Redell Shallow, MD Electrophysiologist: Fonda Kitty, MD      History of Present Illness:   Vincent Howell is a 89 y.o. male with h/o paroxysmal atrial fibrillation, coronary artery disease, hyperlipidemia, hypertension who is being seen today for evaluation for Watchman device at the request of Dr. Shallow.  Discussed the use of AI scribe software for clinical note transcription with the patient, who gave verbal consent to proceed.  History of Present Illness Vincent Howell is an 89 year old male with atrial fibrillation who presents for evaluation of a Watchman device. He was referred by Dr. Shallow for evaluation of a Watchman device due to his history of atrial fibrillation and bleeding complications from blood thinners.  He has a history of atrial fibrillation, initially identified on an EKG. Due to the increased risk of stroke associated with atrial fibrillation, he was started on Eliquis , a blood thinner, to mitigate this risk. However, he has experienced significant bleeding complications while on Eliquis , including spontaneous bleeding from his ankle and a burst blood vessel in his eye. The bleeding from his ankle was severe enough to require emergency intervention, and he has had multiple falls, which further complicate his bleeding risk.  He is considering the Watchman device as an alternative to long-term anticoagulation with Eliquis . The device is intended to reduce stroke risk by sealing the left atrial appendage, where clots can form during atrial fibrillation.  He has one artery that is 90% blocked, another that is 80% blocked, one that is 60% blocked, and two that are 30% blocked. These blockages are not directly related to the Watchman procedure but are acknowledged.  He is allergic to aspirin  and Tylenol , which affects his post-procedure  medication options.    Review of systems complete and found to be negative unless listed in HPI.   EP Information / Studies Reviewed:    EKG is not ordered today. EKG from 07/15/23 reviewed which showed sinus bradycardia with incomplete RBBB     EKG 08/05/21: AF   LHC/RHC 08/07/21:   Mid LAD lesion is 90% stenosed.   2nd Mrg lesion is 25% stenosed.   Prox LAD lesion is 60% stenosed.   Prox RCA lesion is 30% stenosed.   3rd RPL lesion is 80% stenosed.   LV end diastolic pressure is low.   1.  High-grade mid LAD and RPLV stenoses with moderate proximal LAD stenosis as detailed above.  The results were discussed with Dr. Kate.  Given the fact the patient is highly functional without exertional angina and did not develop an acute coronary syndrome with his presentation of syncope leading to his hospitalization (negative HS-troponins), PCI will be deferred and optimal medical therapy pursued. 2.  Normal cardiac output and index with relatively low filling pressures with mean RA of 5 mmHg, mean wedge of 7 mmHg, and LVEDP of 4 mmHg.  Echo 08/06/21: 1. Basal infeior wall hypokinesis . Left ventricular ejection fraction,  by estimation, is 50 to 55%. The left ventricle has low normal function.  The left ventricle has no regional wall motion abnormalities. The left  ventricular internal cavity size was  mildly dilated. There is mild left ventricular hypertrophy. Left  ventricular diastolic parameters were normal.   2. Right ventricular systolic function is normal. The right ventricular  size is normal.   3. Left atrial size was moderately dilated.  4. The mitral valve is abnormal. Mild mitral valve regurgitation. No  evidence of mitral stenosis.   5. The aortic valve is tricuspid. There is mild calcification of the  aortic valve. There is mild thickening of the aortic valve. Aortic valve  regurgitation is mild. Aortic valve sclerosis is present, with no evidence  of aortic valve  stenosis.   6. The inferior vena cava is normal in size with greater than 50%  respiratory variability, suggesting right atrial pressure of 3 mmHg.   Risk Assessment/Calculations:    CHA2DS2-VASc Score = 5   This indicates a 7.2% annual risk of stroke. The patient's score is based upon: CHF History: 0 HTN History: 1 Diabetes History: 1 Stroke History: 0 Vascular Disease History: 1 Age Score: 2 Gender Score: 0             Physical Exam:   VS:  BP 136/70   Pulse (!) 54   Ht 5' 10 (1.778 m)   Wt 177 lb 6.4 oz (80.5 kg)   SpO2 97%   BMI 25.45 kg/m    Wt Readings from Last 3 Encounters:  03/24/24 177 lb 6.4 oz (80.5 kg)  02/28/24 172 lb (78 kg)  02/08/24 176 lb 6.4 oz (80 kg)     General: Well developed, in no acute distress.  Neck: No JVD.  Cardiac: Bradycardic, regular rhythm.  Resp: Normal work of breathing.  Ext: No edema.  Neuro: No gross focal deficits.  Psych: Normal affect.    ASSESSMENT AND PLAN:   I have seen Dallas BIRCH Walle in the office today who is being considered for a Watchman left atrial appendage closure device. I believe they will benefit from this procedure given their history of atrial fibrillation, CHA2DS2-VASc score of 5 and unadjusted ischemic stroke rate of 7.2% per year. Unfortunately, the patient is not felt to be a long term anticoagulation candidate secondary to falls and bleeding. The patient's chart has been reviewed and I feel that they would be a candidate for short term oral anticoagulation after Watchman implant.   It is my belief that after undergoing a LAA closure procedure, Vincent Howell will not need long term anticoagulation which eliminates anticoagulation side effects and major bleeding risk.   Procedural risks for the Watchman implant have been reviewed with the patient including a 0.5% risk of stroke, <1% risk of perforation and <1% risk of device embolization. Other risks include bleeding, vascular damage, tamponade,  worsening renal function, and death. The patient understands these risk and wishes to proceed.     The published clinical data on the safety and effectiveness of WATCHMAN include but are not limited to the following: - Holmes DR, Jess BEARD, Sick P et al. for the PROTECT AF Investigators. Percutaneous closure of the left atrial appendage versus warfarin therapy for prevention of stroke in patients with atrial fibrillation: a randomised non-inferiority trial. Lancet 2009; 374: 534-42. GLENWOOD Jess BEARD, Doshi SK, Jonita VEAR Satchel D et al. on behalf of the PROTECT AF Investigators. Percutaneous Left Atrial Appendage Closure for Stroke Prophylaxis in Patients With Atrial Fibrillation 2.3-Year Follow-up of the PROTECT AF (Watchman Left Atrial Appendage System for Embolic Protection in Patients With Atrial Fibrillation) Trial. Circulation 2013; 127:720-729. - Alli O, Doshi S,  Kar S, Reddy VY, Sievert H et al. Quality of Life Assessment in the Randomized PROTECT AF (Percutaneous Closure of the Left Atrial Appendage Versus Warfarin Therapy for Prevention of Stroke in Patients With Atrial Fibrillation) Trial of Patients  at Risk for Stroke With Nonvalvular Atrial Fibrillation. J Am Coll Cardiol 2013; 61:1790-8. GLENWOOD Satchel DR, Archer RAMAN, Price M, Whisenant B, Sievert H, Doshi S, Huber K, Reddy V. Prospective randomized evaluation of the Watchman left atrial appendage Device in patients with atrial fibrillation versus long-term warfarin therapy; the PREVAIL trial. Journal of the [employer] of Cardiology, Vol. 4, No. 1, 2014, 1-11. - Kar S, Doshi SK, Sadhu A, Horton R, Osorio J et al. Primary outcome evaluation of a next-generation left atrial appendage closure device: results from the PINNACLE FLX trial. Circulation 2021;143(18)1754-1762.    After today's visit with the patient which was dedicated solely for shared decision making visit regarding LAA closure device, the patient decided to proceed with the LAA appendage  closure procedure scheduled to be done in the near future at [facility]. Prior to the procedure, I would like to obtain a gated CT scan of the chest with contrast timed for PV/LA visualization.   HAS-BLED score 3 Hypertension Yes  Abnormal renal and liver function (Dialysis, transplant, Cr >2.26 mg/dL /Cirrhosis or Bilirubin >2x Normal or AST/ALT/AP >3x Normal) No  Stroke No  Bleeding Yes  Labile INR (Unstable/high INR) No  Elderly (>65) Yes  Drugs or alcohol (>= 8 drinks/week, anti-plt or NSAID) No   CHA2DS2-VASc Score = 5  The patient's score is based upon: CHF History: 0 HTN History: 1 Diabetes History: 1 Stroke History: 0 Vascular Disease History: 1 Age Score: 2 Gender Score: 0       ASSESSMENT AND PLAN: Paroxysmal Atrial Fibrillation (ICD10:  I48.0) The patient's CHA2DS2-VASc score is 5, indicating a 7.2% annual risk of stroke.    Secondary Hypercoagulable State (ICD10:  D68.69) The patient is at significant risk for stroke/thromboembolism based upon his CHA2DS2-VASc Score of 5.  Continue Apixaban  (Eliquis ).    Follow up with Dr. Kennyth for [facility] implant.   Signed, Fonda Kennyth, MD

## 2024-03-24 NOTE — Patient Instructions (Signed)
 Medication Instructions:  Your physician recommends that you continue on your current medications as directed. Please refer to the Current Medication list given to you today.  *If you need a refill on your cardiac medications before your next appointment, please call your pharmacy*  Testing/Procedures: Cardiac CT Your physician has requested that you have cardiac CT. Cardiac computed tomography (CT) is a painless test that uses an x-ray machine to take clear, detailed pictures of your heart. For further information please visit [email or web address] Please follow instruction sheet as given. You will be called to schedule this test.   Watchman  Your physician has requested that you have Left atrial appendage (LAA) closure device implantation is a procedure to put a small device in the LAA of the heart. The LAA is a small sac in the wall of the heart's left upper chamber. Blood clots can form in this area. The device, Watchman closes the LAA to help prevent a blood clot and stroke.  You will be contacted by Nurse Navigator, Danielle to schedule your pre-procedure visit and procedure date. If you have any questions she can be reached at [phone].   Follow-Up: At [facility], you and your health needs are our priority.  As part of our continuing mission to provide you with exceptional heart care, our providers are all part of one team.  This team includes your primary Cardiologist (physician) and Advanced Practice Providers or APPs (Physician Assistants and Nurse Practitioners) who all work together to provide you with the care you need, when you need it.

## 2024-04-07 ENCOUNTER — Ambulatory Visit (HOSPITAL_COMMUNITY)
Admission: RE | Admit: 2024-04-07 | Discharge: 2024-04-07 | Disposition: A | Discharge status: Home / Self Care | Source: Ambulatory Visit | Attending: Cardiology | Admitting: Cardiology

## 2024-04-07 DIAGNOSIS — I480 Paroxysmal atrial fibrillation: Secondary | ICD-10-CM

## 2024-04-07 MED ORDER — IOHEXOL 350 MG/ML SOLN
75.0000 mL | Freq: Once | INTRAVENOUS | Status: AC | PRN
  Administered 2024-04-07: 75 mL via INTRAVENOUS

## 2024-04-14 ENCOUNTER — Telehealth: Payer: Self-pay

## 2024-04-14 NOTE — Telephone Encounter (Signed)
 Chicken wing Max 25/ AVG 22/ Depth 14.6 Likely use a 27mm device Inf/Mid-Ant TSP RAO 24 CAU 10

## 2024-04-17 ENCOUNTER — Encounter: Payer: Self-pay | Admitting: Cardiology

## 2024-04-17 ENCOUNTER — Other Ambulatory Visit: Payer: Self-pay | Admitting: *Deleted

## 2024-04-17 NOTE — Telephone Encounter (Signed)
 Spoke with patient. Advised Dr. Kennyth reviewed CT scan and does not feel would be suitable for Watchman. Offered referral for Amulet. Patient will think about it and call office back if he wishes to pursue referral. He was grateful for the call.

## 2024-04-18 NOTE — Telephone Encounter (Signed)
 Patient returned call. He would like to be referred to Dr. Dasie at [facility]. Will send over referral per his request.

## 2024-04-27 ENCOUNTER — Telehealth: Payer: Self-pay | Admitting: Cardiology

## 2024-04-27 DIAGNOSIS — R296 Repeated falls: Secondary | ICD-10-CM

## 2024-04-27 DIAGNOSIS — I480 Paroxysmal atrial fibrillation: Secondary | ICD-10-CM

## 2024-04-27 NOTE — Telephone Encounter (Signed)
 Pt calling to speak with nurse about a conversation they had last week.

## 2024-04-27 NOTE — Telephone Encounter (Signed)
Returning call, please advise.

## 2024-04-27 NOTE — Telephone Encounter (Addendum)
 Referral had been sent on 04/19/24 per patient request. Will send follow up email regarding referral then contact patient with update.

## 2024-04-27 NOTE — Telephone Encounter (Signed)
 Left message to call back.

## 2024-04-27 NOTE — Telephone Encounter (Signed)
 Spoke with pt regarding his question. Pt stated he had spoken to a nurse who said she would be placing a referral to Dr. Dasie. Pt stated they have not yet received the referral. I did not see that the referral was placed. Will forward to Edsel Greek, RN for assistance. Pt stated the referral can be faxed to [phone]. Pt was grateful for the assistance. He verbalized understanding and all questions if any were answered.

## 2024-04-27 NOTE — Addendum Note (Signed)
 Addended by: CHAUVIGNE, Renley Banwart on: 04/27/2024 04:25 PM   Modules accepted: Orders

## 2024-06-07 ENCOUNTER — Other Ambulatory Visit: Payer: Self-pay | Admitting: Cardiology

## 2024-06-11 ENCOUNTER — Other Ambulatory Visit: Payer: Self-pay | Admitting: Internal Medicine

## 2024-06-12 ENCOUNTER — Ambulatory Visit: Payer: Medicare Other

## 2024-06-14 ENCOUNTER — Other Ambulatory Visit: Payer: Self-pay | Admitting: Cardiology

## 2024-06-16 ENCOUNTER — Ambulatory Visit

## 2024-08-03 ENCOUNTER — Ambulatory Visit: Admitting: Cardiology
